# Patient Record
Sex: Male | Born: 1935 | Race: White | Hispanic: No | Smoking: Never smoker
Health system: Southern US, Community
[De-identification: ages and names within clinical notes are randomized; demographics above are authoritative.]

## PROBLEM LIST (undated history)

## (undated) DIAGNOSIS — N400 Enlarged prostate without lower urinary tract symptoms: Secondary | ICD-10-CM

## (undated) DIAGNOSIS — E785 Hyperlipidemia, unspecified: Secondary | ICD-10-CM

## (undated) DIAGNOSIS — M6281 Muscle weakness (generalized): Secondary | ICD-10-CM

## (undated) DIAGNOSIS — Z96659 Presence of unspecified artificial knee joint: Secondary | ICD-10-CM

## (undated) DIAGNOSIS — M810 Age-related osteoporosis without current pathological fracture: Secondary | ICD-10-CM

## (undated) HISTORY — PX: REPLACEMENT TOTAL KNEE BILATERAL: SUR1225

---

## 2007-04-08 DIAGNOSIS — E785 Hyperlipidemia, unspecified: Secondary | ICD-10-CM | POA: Insufficient documentation

## 2008-11-26 DIAGNOSIS — N400 Enlarged prostate without lower urinary tract symptoms: Secondary | ICD-10-CM | POA: Insufficient documentation

## 2009-01-13 DIAGNOSIS — M810 Age-related osteoporosis without current pathological fracture: Secondary | ICD-10-CM | POA: Diagnosis present

## 2012-07-10 DIAGNOSIS — Z96653 Presence of artificial knee joint, bilateral: Secondary | ICD-10-CM | POA: Insufficient documentation

## 2016-04-18 DIAGNOSIS — R2681 Unsteadiness on feet: Secondary | ICD-10-CM | POA: Diagnosis not present

## 2016-04-18 DIAGNOSIS — R42 Dizziness and giddiness: Secondary | ICD-10-CM | POA: Diagnosis not present

## 2016-04-18 DIAGNOSIS — Z96653 Presence of artificial knee joint, bilateral: Secondary | ICD-10-CM | POA: Diagnosis not present

## 2016-04-18 DIAGNOSIS — R29898 Other symptoms and signs involving the musculoskeletal system: Secondary | ICD-10-CM | POA: Diagnosis not present

## 2016-04-18 DIAGNOSIS — R269 Unspecified abnormalities of gait and mobility: Secondary | ICD-10-CM | POA: Diagnosis not present

## 2016-04-21 DIAGNOSIS — R29898 Other symptoms and signs involving the musculoskeletal system: Secondary | ICD-10-CM | POA: Diagnosis not present

## 2016-04-21 DIAGNOSIS — R2681 Unsteadiness on feet: Secondary | ICD-10-CM | POA: Diagnosis not present

## 2016-04-21 DIAGNOSIS — R42 Dizziness and giddiness: Secondary | ICD-10-CM | POA: Diagnosis not present

## 2016-04-21 DIAGNOSIS — R269 Unspecified abnormalities of gait and mobility: Secondary | ICD-10-CM | POA: Diagnosis not present

## 2016-04-21 DIAGNOSIS — Z96653 Presence of artificial knee joint, bilateral: Secondary | ICD-10-CM | POA: Diagnosis not present

## 2016-05-03 DIAGNOSIS — R42 Dizziness and giddiness: Secondary | ICD-10-CM | POA: Diagnosis not present

## 2016-05-03 DIAGNOSIS — Z96653 Presence of artificial knee joint, bilateral: Secondary | ICD-10-CM | POA: Diagnosis not present

## 2016-05-03 DIAGNOSIS — R29898 Other symptoms and signs involving the musculoskeletal system: Secondary | ICD-10-CM | POA: Diagnosis not present

## 2016-05-03 DIAGNOSIS — R2681 Unsteadiness on feet: Secondary | ICD-10-CM | POA: Diagnosis not present

## 2016-05-03 DIAGNOSIS — R269 Unspecified abnormalities of gait and mobility: Secondary | ICD-10-CM | POA: Diagnosis not present

## 2016-05-09 DIAGNOSIS — Z96653 Presence of artificial knee joint, bilateral: Secondary | ICD-10-CM | POA: Diagnosis not present

## 2016-05-09 DIAGNOSIS — R2681 Unsteadiness on feet: Secondary | ICD-10-CM | POA: Diagnosis not present

## 2016-05-09 DIAGNOSIS — R29898 Other symptoms and signs involving the musculoskeletal system: Secondary | ICD-10-CM | POA: Diagnosis not present

## 2016-05-09 DIAGNOSIS — R269 Unspecified abnormalities of gait and mobility: Secondary | ICD-10-CM | POA: Diagnosis not present

## 2016-05-09 DIAGNOSIS — R42 Dizziness and giddiness: Secondary | ICD-10-CM | POA: Diagnosis not present

## 2016-05-15 DIAGNOSIS — R269 Unspecified abnormalities of gait and mobility: Secondary | ICD-10-CM | POA: Diagnosis not present

## 2016-05-15 DIAGNOSIS — Z96653 Presence of artificial knee joint, bilateral: Secondary | ICD-10-CM | POA: Diagnosis not present

## 2016-05-15 DIAGNOSIS — R2681 Unsteadiness on feet: Secondary | ICD-10-CM | POA: Diagnosis not present

## 2016-05-15 DIAGNOSIS — R29898 Other symptoms and signs involving the musculoskeletal system: Secondary | ICD-10-CM | POA: Diagnosis not present

## 2016-05-15 DIAGNOSIS — R42 Dizziness and giddiness: Secondary | ICD-10-CM | POA: Diagnosis not present

## 2016-05-26 DIAGNOSIS — R269 Unspecified abnormalities of gait and mobility: Secondary | ICD-10-CM | POA: Diagnosis not present

## 2016-05-26 DIAGNOSIS — Z96653 Presence of artificial knee joint, bilateral: Secondary | ICD-10-CM | POA: Diagnosis not present

## 2016-05-26 DIAGNOSIS — R29898 Other symptoms and signs involving the musculoskeletal system: Secondary | ICD-10-CM | POA: Diagnosis not present

## 2016-05-26 DIAGNOSIS — R42 Dizziness and giddiness: Secondary | ICD-10-CM | POA: Diagnosis not present

## 2016-05-26 DIAGNOSIS — R2681 Unsteadiness on feet: Secondary | ICD-10-CM | POA: Diagnosis not present

## 2016-06-07 DIAGNOSIS — R269 Unspecified abnormalities of gait and mobility: Secondary | ICD-10-CM | POA: Diagnosis not present

## 2016-06-07 DIAGNOSIS — R29898 Other symptoms and signs involving the musculoskeletal system: Secondary | ICD-10-CM | POA: Diagnosis not present

## 2016-06-07 DIAGNOSIS — R42 Dizziness and giddiness: Secondary | ICD-10-CM | POA: Diagnosis not present

## 2016-06-07 DIAGNOSIS — Z96653 Presence of artificial knee joint, bilateral: Secondary | ICD-10-CM | POA: Diagnosis not present

## 2016-06-07 DIAGNOSIS — R2681 Unsteadiness on feet: Secondary | ICD-10-CM | POA: Diagnosis not present

## 2016-06-09 DIAGNOSIS — R29898 Other symptoms and signs involving the musculoskeletal system: Secondary | ICD-10-CM | POA: Diagnosis not present

## 2016-06-09 DIAGNOSIS — Z96653 Presence of artificial knee joint, bilateral: Secondary | ICD-10-CM | POA: Diagnosis not present

## 2016-06-09 DIAGNOSIS — R269 Unspecified abnormalities of gait and mobility: Secondary | ICD-10-CM | POA: Diagnosis not present

## 2016-06-09 DIAGNOSIS — R42 Dizziness and giddiness: Secondary | ICD-10-CM | POA: Diagnosis not present

## 2016-06-09 DIAGNOSIS — R2681 Unsteadiness on feet: Secondary | ICD-10-CM | POA: Diagnosis not present

## 2016-06-14 DIAGNOSIS — Z96653 Presence of artificial knee joint, bilateral: Secondary | ICD-10-CM | POA: Diagnosis not present

## 2016-06-14 DIAGNOSIS — R269 Unspecified abnormalities of gait and mobility: Secondary | ICD-10-CM | POA: Diagnosis not present

## 2016-06-14 DIAGNOSIS — R29898 Other symptoms and signs involving the musculoskeletal system: Secondary | ICD-10-CM | POA: Diagnosis not present

## 2016-06-14 DIAGNOSIS — R2681 Unsteadiness on feet: Secondary | ICD-10-CM | POA: Diagnosis not present

## 2016-06-14 DIAGNOSIS — R42 Dizziness and giddiness: Secondary | ICD-10-CM | POA: Diagnosis not present

## 2016-06-16 DIAGNOSIS — R269 Unspecified abnormalities of gait and mobility: Secondary | ICD-10-CM | POA: Diagnosis not present

## 2016-06-16 DIAGNOSIS — R42 Dizziness and giddiness: Secondary | ICD-10-CM | POA: Diagnosis not present

## 2016-06-16 DIAGNOSIS — Z96653 Presence of artificial knee joint, bilateral: Secondary | ICD-10-CM | POA: Diagnosis not present

## 2016-06-16 DIAGNOSIS — R2681 Unsteadiness on feet: Secondary | ICD-10-CM | POA: Diagnosis not present

## 2016-06-16 DIAGNOSIS — R29898 Other symptoms and signs involving the musculoskeletal system: Secondary | ICD-10-CM | POA: Diagnosis not present

## 2016-06-20 DIAGNOSIS — L84 Corns and callosities: Secondary | ICD-10-CM | POA: Diagnosis not present

## 2016-06-26 DIAGNOSIS — R3914 Feeling of incomplete bladder emptying: Secondary | ICD-10-CM | POA: Diagnosis not present

## 2016-06-26 DIAGNOSIS — N210 Calculus in bladder: Secondary | ICD-10-CM | POA: Diagnosis not present

## 2016-06-26 DIAGNOSIS — N401 Enlarged prostate with lower urinary tract symptoms: Secondary | ICD-10-CM | POA: Diagnosis not present

## 2016-06-27 DIAGNOSIS — R269 Unspecified abnormalities of gait and mobility: Secondary | ICD-10-CM | POA: Diagnosis not present

## 2016-06-27 DIAGNOSIS — Z96653 Presence of artificial knee joint, bilateral: Secondary | ICD-10-CM | POA: Diagnosis not present

## 2016-06-27 DIAGNOSIS — R2681 Unsteadiness on feet: Secondary | ICD-10-CM | POA: Diagnosis not present

## 2016-06-27 DIAGNOSIS — R29898 Other symptoms and signs involving the musculoskeletal system: Secondary | ICD-10-CM | POA: Diagnosis not present

## 2016-06-27 DIAGNOSIS — R42 Dizziness and giddiness: Secondary | ICD-10-CM | POA: Diagnosis not present

## 2016-06-29 DIAGNOSIS — B355 Tinea imbricata: Secondary | ICD-10-CM | POA: Diagnosis not present

## 2016-06-29 DIAGNOSIS — L570 Actinic keratosis: Secondary | ICD-10-CM | POA: Diagnosis not present

## 2016-07-05 DIAGNOSIS — R29898 Other symptoms and signs involving the musculoskeletal system: Secondary | ICD-10-CM | POA: Diagnosis not present

## 2016-07-05 DIAGNOSIS — Z96653 Presence of artificial knee joint, bilateral: Secondary | ICD-10-CM | POA: Diagnosis not present

## 2016-07-05 DIAGNOSIS — R2681 Unsteadiness on feet: Secondary | ICD-10-CM | POA: Diagnosis not present

## 2016-07-05 DIAGNOSIS — R269 Unspecified abnormalities of gait and mobility: Secondary | ICD-10-CM | POA: Diagnosis not present

## 2016-07-05 DIAGNOSIS — R42 Dizziness and giddiness: Secondary | ICD-10-CM | POA: Diagnosis not present

## 2016-07-12 DIAGNOSIS — R269 Unspecified abnormalities of gait and mobility: Secondary | ICD-10-CM | POA: Diagnosis not present

## 2016-07-12 DIAGNOSIS — R42 Dizziness and giddiness: Secondary | ICD-10-CM | POA: Diagnosis not present

## 2016-07-12 DIAGNOSIS — R29898 Other symptoms and signs involving the musculoskeletal system: Secondary | ICD-10-CM | POA: Diagnosis not present

## 2016-07-12 DIAGNOSIS — R2681 Unsteadiness on feet: Secondary | ICD-10-CM | POA: Diagnosis not present

## 2016-07-12 DIAGNOSIS — Z96653 Presence of artificial knee joint, bilateral: Secondary | ICD-10-CM | POA: Diagnosis not present

## 2016-07-13 DIAGNOSIS — R635 Abnormal weight gain: Secondary | ICD-10-CM | POA: Diagnosis not present

## 2016-07-13 DIAGNOSIS — R5383 Other fatigue: Secondary | ICD-10-CM | POA: Diagnosis not present

## 2016-07-13 DIAGNOSIS — M8580 Other specified disorders of bone density and structure, unspecified site: Secondary | ICD-10-CM | POA: Diagnosis not present

## 2016-07-13 DIAGNOSIS — R2681 Unsteadiness on feet: Secondary | ICD-10-CM | POA: Diagnosis not present

## 2016-07-13 DIAGNOSIS — E785 Hyperlipidemia, unspecified: Secondary | ICD-10-CM | POA: Diagnosis not present

## 2016-07-19 DIAGNOSIS — R42 Dizziness and giddiness: Secondary | ICD-10-CM | POA: Diagnosis not present

## 2016-07-19 DIAGNOSIS — Z96653 Presence of artificial knee joint, bilateral: Secondary | ICD-10-CM | POA: Diagnosis not present

## 2016-07-19 DIAGNOSIS — R2681 Unsteadiness on feet: Secondary | ICD-10-CM | POA: Diagnosis not present

## 2016-07-19 DIAGNOSIS — R269 Unspecified abnormalities of gait and mobility: Secondary | ICD-10-CM | POA: Diagnosis not present

## 2016-07-19 DIAGNOSIS — R29898 Other symptoms and signs involving the musculoskeletal system: Secondary | ICD-10-CM | POA: Diagnosis not present

## 2016-07-25 DIAGNOSIS — Z96653 Presence of artificial knee joint, bilateral: Secondary | ICD-10-CM | POA: Diagnosis not present

## 2016-07-25 DIAGNOSIS — R2681 Unsteadiness on feet: Secondary | ICD-10-CM | POA: Diagnosis not present

## 2016-07-25 DIAGNOSIS — R269 Unspecified abnormalities of gait and mobility: Secondary | ICD-10-CM | POA: Diagnosis not present

## 2016-07-25 DIAGNOSIS — R42 Dizziness and giddiness: Secondary | ICD-10-CM | POA: Diagnosis not present

## 2016-07-25 DIAGNOSIS — R29898 Other symptoms and signs involving the musculoskeletal system: Secondary | ICD-10-CM | POA: Diagnosis not present

## 2016-07-27 DIAGNOSIS — Z96653 Presence of artificial knee joint, bilateral: Secondary | ICD-10-CM | POA: Diagnosis not present

## 2016-07-27 DIAGNOSIS — R29898 Other symptoms and signs involving the musculoskeletal system: Secondary | ICD-10-CM | POA: Diagnosis not present

## 2016-07-27 DIAGNOSIS — R2681 Unsteadiness on feet: Secondary | ICD-10-CM | POA: Diagnosis not present

## 2016-07-27 DIAGNOSIS — R42 Dizziness and giddiness: Secondary | ICD-10-CM | POA: Diagnosis not present

## 2016-07-27 DIAGNOSIS — R269 Unspecified abnormalities of gait and mobility: Secondary | ICD-10-CM | POA: Diagnosis not present

## 2016-08-08 DIAGNOSIS — R42 Dizziness and giddiness: Secondary | ICD-10-CM | POA: Diagnosis not present

## 2016-08-08 DIAGNOSIS — R2681 Unsteadiness on feet: Secondary | ICD-10-CM | POA: Diagnosis not present

## 2016-08-08 DIAGNOSIS — R269 Unspecified abnormalities of gait and mobility: Secondary | ICD-10-CM | POA: Diagnosis not present

## 2016-08-08 DIAGNOSIS — Z96653 Presence of artificial knee joint, bilateral: Secondary | ICD-10-CM | POA: Diagnosis not present

## 2016-08-08 DIAGNOSIS — R29898 Other symptoms and signs involving the musculoskeletal system: Secondary | ICD-10-CM | POA: Diagnosis not present

## 2016-08-11 DIAGNOSIS — Z96653 Presence of artificial knee joint, bilateral: Secondary | ICD-10-CM | POA: Diagnosis not present

## 2016-08-11 DIAGNOSIS — R29898 Other symptoms and signs involving the musculoskeletal system: Secondary | ICD-10-CM | POA: Diagnosis not present

## 2016-08-11 DIAGNOSIS — R2681 Unsteadiness on feet: Secondary | ICD-10-CM | POA: Diagnosis not present

## 2016-08-11 DIAGNOSIS — R42 Dizziness and giddiness: Secondary | ICD-10-CM | POA: Diagnosis not present

## 2016-08-11 DIAGNOSIS — R269 Unspecified abnormalities of gait and mobility: Secondary | ICD-10-CM | POA: Diagnosis not present

## 2016-08-18 DIAGNOSIS — R42 Dizziness and giddiness: Secondary | ICD-10-CM | POA: Diagnosis not present

## 2016-08-18 DIAGNOSIS — R29898 Other symptoms and signs involving the musculoskeletal system: Secondary | ICD-10-CM | POA: Diagnosis not present

## 2016-08-18 DIAGNOSIS — R269 Unspecified abnormalities of gait and mobility: Secondary | ICD-10-CM | POA: Diagnosis not present

## 2016-08-18 DIAGNOSIS — Z96653 Presence of artificial knee joint, bilateral: Secondary | ICD-10-CM | POA: Diagnosis not present

## 2016-08-18 DIAGNOSIS — R2681 Unsteadiness on feet: Secondary | ICD-10-CM | POA: Diagnosis not present

## 2016-08-22 DIAGNOSIS — R2681 Unsteadiness on feet: Secondary | ICD-10-CM | POA: Diagnosis not present

## 2016-08-22 DIAGNOSIS — R269 Unspecified abnormalities of gait and mobility: Secondary | ICD-10-CM | POA: Diagnosis not present

## 2016-08-22 DIAGNOSIS — R29898 Other symptoms and signs involving the musculoskeletal system: Secondary | ICD-10-CM | POA: Diagnosis not present

## 2016-08-22 DIAGNOSIS — R42 Dizziness and giddiness: Secondary | ICD-10-CM | POA: Diagnosis not present

## 2016-08-22 DIAGNOSIS — Z96653 Presence of artificial knee joint, bilateral: Secondary | ICD-10-CM | POA: Diagnosis not present

## 2016-08-25 DIAGNOSIS — R2681 Unsteadiness on feet: Secondary | ICD-10-CM | POA: Diagnosis not present

## 2016-08-25 DIAGNOSIS — R42 Dizziness and giddiness: Secondary | ICD-10-CM | POA: Diagnosis not present

## 2016-08-25 DIAGNOSIS — R269 Unspecified abnormalities of gait and mobility: Secondary | ICD-10-CM | POA: Diagnosis not present

## 2016-08-25 DIAGNOSIS — R29898 Other symptoms and signs involving the musculoskeletal system: Secondary | ICD-10-CM | POA: Diagnosis not present

## 2016-08-25 DIAGNOSIS — Z96653 Presence of artificial knee joint, bilateral: Secondary | ICD-10-CM | POA: Diagnosis not present

## 2016-09-01 DIAGNOSIS — R269 Unspecified abnormalities of gait and mobility: Secondary | ICD-10-CM | POA: Diagnosis not present

## 2016-09-01 DIAGNOSIS — Z96653 Presence of artificial knee joint, bilateral: Secondary | ICD-10-CM | POA: Diagnosis not present

## 2016-09-01 DIAGNOSIS — R29898 Other symptoms and signs involving the musculoskeletal system: Secondary | ICD-10-CM | POA: Diagnosis not present

## 2016-09-01 DIAGNOSIS — R42 Dizziness and giddiness: Secondary | ICD-10-CM | POA: Diagnosis not present

## 2016-09-01 DIAGNOSIS — R2681 Unsteadiness on feet: Secondary | ICD-10-CM | POA: Diagnosis not present

## 2016-09-08 DIAGNOSIS — R269 Unspecified abnormalities of gait and mobility: Secondary | ICD-10-CM | POA: Diagnosis not present

## 2016-09-08 DIAGNOSIS — R29898 Other symptoms and signs involving the musculoskeletal system: Secondary | ICD-10-CM | POA: Diagnosis not present

## 2016-09-08 DIAGNOSIS — R2681 Unsteadiness on feet: Secondary | ICD-10-CM | POA: Diagnosis not present

## 2016-09-08 DIAGNOSIS — R42 Dizziness and giddiness: Secondary | ICD-10-CM | POA: Diagnosis not present

## 2016-09-08 DIAGNOSIS — Z96653 Presence of artificial knee joint, bilateral: Secondary | ICD-10-CM | POA: Diagnosis not present

## 2016-09-11 DIAGNOSIS — R29898 Other symptoms and signs involving the musculoskeletal system: Secondary | ICD-10-CM | POA: Diagnosis not present

## 2016-09-11 DIAGNOSIS — R269 Unspecified abnormalities of gait and mobility: Secondary | ICD-10-CM | POA: Diagnosis not present

## 2016-09-11 DIAGNOSIS — Z96653 Presence of artificial knee joint, bilateral: Secondary | ICD-10-CM | POA: Diagnosis not present

## 2016-09-11 DIAGNOSIS — R42 Dizziness and giddiness: Secondary | ICD-10-CM | POA: Diagnosis not present

## 2016-09-11 DIAGNOSIS — R2681 Unsteadiness on feet: Secondary | ICD-10-CM | POA: Diagnosis not present

## 2016-09-15 DIAGNOSIS — R269 Unspecified abnormalities of gait and mobility: Secondary | ICD-10-CM | POA: Diagnosis not present

## 2016-09-15 DIAGNOSIS — Z96653 Presence of artificial knee joint, bilateral: Secondary | ICD-10-CM | POA: Diagnosis not present

## 2016-09-15 DIAGNOSIS — R2681 Unsteadiness on feet: Secondary | ICD-10-CM | POA: Diagnosis not present

## 2016-09-15 DIAGNOSIS — R42 Dizziness and giddiness: Secondary | ICD-10-CM | POA: Diagnosis not present

## 2016-09-15 DIAGNOSIS — R29898 Other symptoms and signs involving the musculoskeletal system: Secondary | ICD-10-CM | POA: Diagnosis not present

## 2016-09-19 DIAGNOSIS — L84 Corns and callosities: Secondary | ICD-10-CM | POA: Diagnosis not present

## 2016-09-27 DIAGNOSIS — R2681 Unsteadiness on feet: Secondary | ICD-10-CM | POA: Diagnosis not present

## 2016-09-27 DIAGNOSIS — R42 Dizziness and giddiness: Secondary | ICD-10-CM | POA: Diagnosis not present

## 2016-09-27 DIAGNOSIS — R29898 Other symptoms and signs involving the musculoskeletal system: Secondary | ICD-10-CM | POA: Diagnosis not present

## 2016-09-27 DIAGNOSIS — Z96653 Presence of artificial knee joint, bilateral: Secondary | ICD-10-CM | POA: Diagnosis not present

## 2016-09-27 DIAGNOSIS — R269 Unspecified abnormalities of gait and mobility: Secondary | ICD-10-CM | POA: Diagnosis not present

## 2016-10-04 DIAGNOSIS — R2681 Unsteadiness on feet: Secondary | ICD-10-CM | POA: Diagnosis not present

## 2016-10-04 DIAGNOSIS — R269 Unspecified abnormalities of gait and mobility: Secondary | ICD-10-CM | POA: Diagnosis not present

## 2016-10-04 DIAGNOSIS — R29898 Other symptoms and signs involving the musculoskeletal system: Secondary | ICD-10-CM | POA: Diagnosis not present

## 2016-10-04 DIAGNOSIS — R42 Dizziness and giddiness: Secondary | ICD-10-CM | POA: Diagnosis not present

## 2016-10-04 DIAGNOSIS — Z96653 Presence of artificial knee joint, bilateral: Secondary | ICD-10-CM | POA: Diagnosis not present

## 2016-10-06 DIAGNOSIS — Z96653 Presence of artificial knee joint, bilateral: Secondary | ICD-10-CM | POA: Diagnosis not present

## 2016-10-06 DIAGNOSIS — R42 Dizziness and giddiness: Secondary | ICD-10-CM | POA: Diagnosis not present

## 2016-10-06 DIAGNOSIS — R269 Unspecified abnormalities of gait and mobility: Secondary | ICD-10-CM | POA: Diagnosis not present

## 2016-10-06 DIAGNOSIS — R29898 Other symptoms and signs involving the musculoskeletal system: Secondary | ICD-10-CM | POA: Diagnosis not present

## 2016-10-06 DIAGNOSIS — R2681 Unsteadiness on feet: Secondary | ICD-10-CM | POA: Diagnosis not present

## 2016-10-18 DIAGNOSIS — R269 Unspecified abnormalities of gait and mobility: Secondary | ICD-10-CM | POA: Diagnosis not present

## 2016-10-18 DIAGNOSIS — R29898 Other symptoms and signs involving the musculoskeletal system: Secondary | ICD-10-CM | POA: Diagnosis not present

## 2016-10-18 DIAGNOSIS — R42 Dizziness and giddiness: Secondary | ICD-10-CM | POA: Diagnosis not present

## 2016-10-18 DIAGNOSIS — Z96653 Presence of artificial knee joint, bilateral: Secondary | ICD-10-CM | POA: Diagnosis not present

## 2016-10-18 DIAGNOSIS — R2681 Unsteadiness on feet: Secondary | ICD-10-CM | POA: Diagnosis not present

## 2016-10-20 DIAGNOSIS — R269 Unspecified abnormalities of gait and mobility: Secondary | ICD-10-CM | POA: Diagnosis not present

## 2016-10-20 DIAGNOSIS — R2681 Unsteadiness on feet: Secondary | ICD-10-CM | POA: Diagnosis not present

## 2016-10-20 DIAGNOSIS — Z96653 Presence of artificial knee joint, bilateral: Secondary | ICD-10-CM | POA: Diagnosis not present

## 2016-10-20 DIAGNOSIS — R29898 Other symptoms and signs involving the musculoskeletal system: Secondary | ICD-10-CM | POA: Diagnosis not present

## 2016-10-20 DIAGNOSIS — R42 Dizziness and giddiness: Secondary | ICD-10-CM | POA: Diagnosis not present

## 2016-10-23 DIAGNOSIS — R269 Unspecified abnormalities of gait and mobility: Secondary | ICD-10-CM | POA: Diagnosis not present

## 2016-10-23 DIAGNOSIS — R2681 Unsteadiness on feet: Secondary | ICD-10-CM | POA: Diagnosis not present

## 2016-10-23 DIAGNOSIS — R42 Dizziness and giddiness: Secondary | ICD-10-CM | POA: Diagnosis not present

## 2016-10-23 DIAGNOSIS — Z96653 Presence of artificial knee joint, bilateral: Secondary | ICD-10-CM | POA: Diagnosis not present

## 2016-10-23 DIAGNOSIS — R29898 Other symptoms and signs involving the musculoskeletal system: Secondary | ICD-10-CM | POA: Diagnosis not present

## 2016-10-26 DIAGNOSIS — R42 Dizziness and giddiness: Secondary | ICD-10-CM | POA: Diagnosis not present

## 2016-10-26 DIAGNOSIS — R269 Unspecified abnormalities of gait and mobility: Secondary | ICD-10-CM | POA: Diagnosis not present

## 2016-10-26 DIAGNOSIS — Z96653 Presence of artificial knee joint, bilateral: Secondary | ICD-10-CM | POA: Diagnosis not present

## 2016-10-26 DIAGNOSIS — R29898 Other symptoms and signs involving the musculoskeletal system: Secondary | ICD-10-CM | POA: Diagnosis not present

## 2016-10-26 DIAGNOSIS — R2681 Unsteadiness on feet: Secondary | ICD-10-CM | POA: Diagnosis not present

## 2016-10-30 DIAGNOSIS — Z96653 Presence of artificial knee joint, bilateral: Secondary | ICD-10-CM | POA: Diagnosis not present

## 2016-10-30 DIAGNOSIS — R29898 Other symptoms and signs involving the musculoskeletal system: Secondary | ICD-10-CM | POA: Diagnosis not present

## 2016-10-30 DIAGNOSIS — R269 Unspecified abnormalities of gait and mobility: Secondary | ICD-10-CM | POA: Diagnosis not present

## 2016-10-30 DIAGNOSIS — R42 Dizziness and giddiness: Secondary | ICD-10-CM | POA: Diagnosis not present

## 2016-10-30 DIAGNOSIS — R2681 Unsteadiness on feet: Secondary | ICD-10-CM | POA: Diagnosis not present

## 2016-11-02 DIAGNOSIS — R42 Dizziness and giddiness: Secondary | ICD-10-CM | POA: Diagnosis not present

## 2016-11-02 DIAGNOSIS — R2681 Unsteadiness on feet: Secondary | ICD-10-CM | POA: Diagnosis not present

## 2016-11-02 DIAGNOSIS — R269 Unspecified abnormalities of gait and mobility: Secondary | ICD-10-CM | POA: Diagnosis not present

## 2016-11-02 DIAGNOSIS — Z96653 Presence of artificial knee joint, bilateral: Secondary | ICD-10-CM | POA: Diagnosis not present

## 2016-11-02 DIAGNOSIS — R29898 Other symptoms and signs involving the musculoskeletal system: Secondary | ICD-10-CM | POA: Diagnosis not present

## 2016-11-09 DIAGNOSIS — R269 Unspecified abnormalities of gait and mobility: Secondary | ICD-10-CM | POA: Diagnosis not present

## 2016-11-09 DIAGNOSIS — R42 Dizziness and giddiness: Secondary | ICD-10-CM | POA: Diagnosis not present

## 2016-11-09 DIAGNOSIS — R2681 Unsteadiness on feet: Secondary | ICD-10-CM | POA: Diagnosis not present

## 2016-11-09 DIAGNOSIS — Z96653 Presence of artificial knee joint, bilateral: Secondary | ICD-10-CM | POA: Diagnosis not present

## 2016-11-09 DIAGNOSIS — R29898 Other symptoms and signs involving the musculoskeletal system: Secondary | ICD-10-CM | POA: Diagnosis not present

## 2016-11-13 DIAGNOSIS — R269 Unspecified abnormalities of gait and mobility: Secondary | ICD-10-CM | POA: Diagnosis not present

## 2016-11-13 DIAGNOSIS — R29898 Other symptoms and signs involving the musculoskeletal system: Secondary | ICD-10-CM | POA: Diagnosis not present

## 2016-11-13 DIAGNOSIS — R2681 Unsteadiness on feet: Secondary | ICD-10-CM | POA: Diagnosis not present

## 2016-11-13 DIAGNOSIS — R42 Dizziness and giddiness: Secondary | ICD-10-CM | POA: Diagnosis not present

## 2016-11-13 DIAGNOSIS — Z96653 Presence of artificial knee joint, bilateral: Secondary | ICD-10-CM | POA: Diagnosis not present

## 2016-11-17 DIAGNOSIS — Z96653 Presence of artificial knee joint, bilateral: Secondary | ICD-10-CM | POA: Diagnosis not present

## 2016-11-17 DIAGNOSIS — R269 Unspecified abnormalities of gait and mobility: Secondary | ICD-10-CM | POA: Diagnosis not present

## 2016-11-17 DIAGNOSIS — R29898 Other symptoms and signs involving the musculoskeletal system: Secondary | ICD-10-CM | POA: Diagnosis not present

## 2016-11-17 DIAGNOSIS — R2681 Unsteadiness on feet: Secondary | ICD-10-CM | POA: Diagnosis not present

## 2016-11-17 DIAGNOSIS — R42 Dizziness and giddiness: Secondary | ICD-10-CM | POA: Diagnosis not present

## 2016-11-24 DIAGNOSIS — R269 Unspecified abnormalities of gait and mobility: Secondary | ICD-10-CM | POA: Diagnosis not present

## 2016-11-24 DIAGNOSIS — R42 Dizziness and giddiness: Secondary | ICD-10-CM | POA: Diagnosis not present

## 2016-11-24 DIAGNOSIS — R2681 Unsteadiness on feet: Secondary | ICD-10-CM | POA: Diagnosis not present

## 2016-11-24 DIAGNOSIS — R29898 Other symptoms and signs involving the musculoskeletal system: Secondary | ICD-10-CM | POA: Diagnosis not present

## 2016-11-24 DIAGNOSIS — Z96653 Presence of artificial knee joint, bilateral: Secondary | ICD-10-CM | POA: Diagnosis not present

## 2016-11-27 DIAGNOSIS — R42 Dizziness and giddiness: Secondary | ICD-10-CM | POA: Diagnosis not present

## 2016-11-27 DIAGNOSIS — R2681 Unsteadiness on feet: Secondary | ICD-10-CM | POA: Diagnosis not present

## 2016-11-27 DIAGNOSIS — Z96653 Presence of artificial knee joint, bilateral: Secondary | ICD-10-CM | POA: Diagnosis not present

## 2016-11-27 DIAGNOSIS — R29898 Other symptoms and signs involving the musculoskeletal system: Secondary | ICD-10-CM | POA: Diagnosis not present

## 2016-11-27 DIAGNOSIS — R269 Unspecified abnormalities of gait and mobility: Secondary | ICD-10-CM | POA: Diagnosis not present

## 2016-11-29 DIAGNOSIS — R2681 Unsteadiness on feet: Secondary | ICD-10-CM | POA: Diagnosis not present

## 2016-11-29 DIAGNOSIS — R42 Dizziness and giddiness: Secondary | ICD-10-CM | POA: Diagnosis not present

## 2016-11-29 DIAGNOSIS — R269 Unspecified abnormalities of gait and mobility: Secondary | ICD-10-CM | POA: Diagnosis not present

## 2016-11-29 DIAGNOSIS — Z96653 Presence of artificial knee joint, bilateral: Secondary | ICD-10-CM | POA: Diagnosis not present

## 2016-11-29 DIAGNOSIS — R29898 Other symptoms and signs involving the musculoskeletal system: Secondary | ICD-10-CM | POA: Diagnosis not present

## 2016-12-08 DIAGNOSIS — R42 Dizziness and giddiness: Secondary | ICD-10-CM | POA: Diagnosis not present

## 2016-12-08 DIAGNOSIS — R269 Unspecified abnormalities of gait and mobility: Secondary | ICD-10-CM | POA: Diagnosis not present

## 2016-12-08 DIAGNOSIS — Z96653 Presence of artificial knee joint, bilateral: Secondary | ICD-10-CM | POA: Diagnosis not present

## 2016-12-08 DIAGNOSIS — R29898 Other symptoms and signs involving the musculoskeletal system: Secondary | ICD-10-CM | POA: Diagnosis not present

## 2016-12-08 DIAGNOSIS — R2681 Unsteadiness on feet: Secondary | ICD-10-CM | POA: Diagnosis not present

## 2016-12-13 DIAGNOSIS — R29898 Other symptoms and signs involving the musculoskeletal system: Secondary | ICD-10-CM | POA: Diagnosis not present

## 2016-12-13 DIAGNOSIS — R42 Dizziness and giddiness: Secondary | ICD-10-CM | POA: Diagnosis not present

## 2016-12-13 DIAGNOSIS — R2681 Unsteadiness on feet: Secondary | ICD-10-CM | POA: Diagnosis not present

## 2016-12-13 DIAGNOSIS — R5383 Other fatigue: Secondary | ICD-10-CM | POA: Diagnosis not present

## 2016-12-13 DIAGNOSIS — R269 Unspecified abnormalities of gait and mobility: Secondary | ICD-10-CM | POA: Diagnosis not present

## 2016-12-13 DIAGNOSIS — Z96653 Presence of artificial knee joint, bilateral: Secondary | ICD-10-CM | POA: Diagnosis not present

## 2016-12-15 DIAGNOSIS — Z96653 Presence of artificial knee joint, bilateral: Secondary | ICD-10-CM | POA: Diagnosis not present

## 2016-12-15 DIAGNOSIS — R269 Unspecified abnormalities of gait and mobility: Secondary | ICD-10-CM | POA: Diagnosis not present

## 2016-12-15 DIAGNOSIS — R42 Dizziness and giddiness: Secondary | ICD-10-CM | POA: Diagnosis not present

## 2016-12-15 DIAGNOSIS — R2681 Unsteadiness on feet: Secondary | ICD-10-CM | POA: Diagnosis not present

## 2016-12-15 DIAGNOSIS — R29898 Other symptoms and signs involving the musculoskeletal system: Secondary | ICD-10-CM | POA: Diagnosis not present

## 2016-12-25 DIAGNOSIS — N401 Enlarged prostate with lower urinary tract symptoms: Secondary | ICD-10-CM | POA: Diagnosis not present

## 2016-12-25 DIAGNOSIS — R3914 Feeling of incomplete bladder emptying: Secondary | ICD-10-CM | POA: Diagnosis not present

## 2016-12-25 DIAGNOSIS — N210 Calculus in bladder: Secondary | ICD-10-CM | POA: Diagnosis not present

## 2016-12-28 DIAGNOSIS — L570 Actinic keratosis: Secondary | ICD-10-CM | POA: Diagnosis not present

## 2016-12-29 DIAGNOSIS — R29898 Other symptoms and signs involving the musculoskeletal system: Secondary | ICD-10-CM | POA: Diagnosis not present

## 2016-12-29 DIAGNOSIS — Z96653 Presence of artificial knee joint, bilateral: Secondary | ICD-10-CM | POA: Diagnosis not present

## 2016-12-29 DIAGNOSIS — R42 Dizziness and giddiness: Secondary | ICD-10-CM | POA: Diagnosis not present

## 2016-12-29 DIAGNOSIS — R269 Unspecified abnormalities of gait and mobility: Secondary | ICD-10-CM | POA: Diagnosis not present

## 2016-12-29 DIAGNOSIS — R2681 Unsteadiness on feet: Secondary | ICD-10-CM | POA: Diagnosis not present

## 2017-01-01 DIAGNOSIS — Z96653 Presence of artificial knee joint, bilateral: Secondary | ICD-10-CM | POA: Diagnosis not present

## 2017-01-01 DIAGNOSIS — R269 Unspecified abnormalities of gait and mobility: Secondary | ICD-10-CM | POA: Diagnosis not present

## 2017-01-01 DIAGNOSIS — R2681 Unsteadiness on feet: Secondary | ICD-10-CM | POA: Diagnosis not present

## 2017-01-01 DIAGNOSIS — R29898 Other symptoms and signs involving the musculoskeletal system: Secondary | ICD-10-CM | POA: Diagnosis not present

## 2017-01-01 DIAGNOSIS — R42 Dizziness and giddiness: Secondary | ICD-10-CM | POA: Diagnosis not present

## 2017-01-05 DIAGNOSIS — Z96653 Presence of artificial knee joint, bilateral: Secondary | ICD-10-CM | POA: Diagnosis not present

## 2017-01-05 DIAGNOSIS — R269 Unspecified abnormalities of gait and mobility: Secondary | ICD-10-CM | POA: Diagnosis not present

## 2017-01-05 DIAGNOSIS — R42 Dizziness and giddiness: Secondary | ICD-10-CM | POA: Diagnosis not present

## 2017-01-05 DIAGNOSIS — R29898 Other symptoms and signs involving the musculoskeletal system: Secondary | ICD-10-CM | POA: Diagnosis not present

## 2017-01-05 DIAGNOSIS — R2681 Unsteadiness on feet: Secondary | ICD-10-CM | POA: Diagnosis not present

## 2017-01-09 DIAGNOSIS — R29898 Other symptoms and signs involving the musculoskeletal system: Secondary | ICD-10-CM | POA: Diagnosis not present

## 2017-01-09 DIAGNOSIS — Z9181 History of falling: Secondary | ICD-10-CM | POA: Diagnosis not present

## 2017-01-09 DIAGNOSIS — R269 Unspecified abnormalities of gait and mobility: Secondary | ICD-10-CM | POA: Diagnosis not present

## 2017-01-09 DIAGNOSIS — Z96653 Presence of artificial knee joint, bilateral: Secondary | ICD-10-CM | POA: Diagnosis not present

## 2017-01-09 DIAGNOSIS — R2681 Unsteadiness on feet: Secondary | ICD-10-CM | POA: Diagnosis not present

## 2017-01-09 DIAGNOSIS — R42 Dizziness and giddiness: Secondary | ICD-10-CM | POA: Diagnosis not present

## 2017-01-09 DIAGNOSIS — R531 Weakness: Secondary | ICD-10-CM | POA: Diagnosis not present

## 2017-01-11 DIAGNOSIS — L84 Corns and callosities: Secondary | ICD-10-CM | POA: Diagnosis not present

## 2017-01-11 DIAGNOSIS — M216X1 Other acquired deformities of right foot: Secondary | ICD-10-CM | POA: Diagnosis not present

## 2017-01-12 DIAGNOSIS — R269 Unspecified abnormalities of gait and mobility: Secondary | ICD-10-CM | POA: Diagnosis not present

## 2017-01-12 DIAGNOSIS — R29898 Other symptoms and signs involving the musculoskeletal system: Secondary | ICD-10-CM | POA: Diagnosis not present

## 2017-01-12 DIAGNOSIS — Z96653 Presence of artificial knee joint, bilateral: Secondary | ICD-10-CM | POA: Diagnosis not present

## 2017-01-12 DIAGNOSIS — R531 Weakness: Secondary | ICD-10-CM | POA: Diagnosis not present

## 2017-01-12 DIAGNOSIS — R42 Dizziness and giddiness: Secondary | ICD-10-CM | POA: Diagnosis not present

## 2017-01-12 DIAGNOSIS — R2681 Unsteadiness on feet: Secondary | ICD-10-CM | POA: Diagnosis not present

## 2017-01-15 DIAGNOSIS — R5383 Other fatigue: Secondary | ICD-10-CM | POA: Diagnosis not present

## 2017-01-15 DIAGNOSIS — Z23 Encounter for immunization: Secondary | ICD-10-CM | POA: Diagnosis not present

## 2017-01-15 DIAGNOSIS — Z0000 Encounter for general adult medical examination without abnormal findings: Secondary | ICD-10-CM | POA: Diagnosis not present

## 2017-01-15 DIAGNOSIS — R2681 Unsteadiness on feet: Secondary | ICD-10-CM | POA: Diagnosis not present

## 2017-01-15 DIAGNOSIS — M8580 Other specified disorders of bone density and structure, unspecified site: Secondary | ICD-10-CM | POA: Diagnosis not present

## 2017-01-22 DIAGNOSIS — R42 Dizziness and giddiness: Secondary | ICD-10-CM | POA: Diagnosis not present

## 2017-01-22 DIAGNOSIS — Z96653 Presence of artificial knee joint, bilateral: Secondary | ICD-10-CM | POA: Diagnosis not present

## 2017-01-22 DIAGNOSIS — R531 Weakness: Secondary | ICD-10-CM | POA: Diagnosis not present

## 2017-01-22 DIAGNOSIS — R29898 Other symptoms and signs involving the musculoskeletal system: Secondary | ICD-10-CM | POA: Diagnosis not present

## 2017-01-22 DIAGNOSIS — R269 Unspecified abnormalities of gait and mobility: Secondary | ICD-10-CM | POA: Diagnosis not present

## 2017-01-22 DIAGNOSIS — R2681 Unsteadiness on feet: Secondary | ICD-10-CM | POA: Diagnosis not present

## 2017-01-25 DIAGNOSIS — R269 Unspecified abnormalities of gait and mobility: Secondary | ICD-10-CM | POA: Diagnosis not present

## 2017-01-25 DIAGNOSIS — R531 Weakness: Secondary | ICD-10-CM | POA: Diagnosis not present

## 2017-01-25 DIAGNOSIS — R29898 Other symptoms and signs involving the musculoskeletal system: Secondary | ICD-10-CM | POA: Diagnosis not present

## 2017-01-25 DIAGNOSIS — R2681 Unsteadiness on feet: Secondary | ICD-10-CM | POA: Diagnosis not present

## 2017-01-25 DIAGNOSIS — Z96653 Presence of artificial knee joint, bilateral: Secondary | ICD-10-CM | POA: Diagnosis not present

## 2017-01-25 DIAGNOSIS — R42 Dizziness and giddiness: Secondary | ICD-10-CM | POA: Diagnosis not present

## 2017-01-29 DIAGNOSIS — R29898 Other symptoms and signs involving the musculoskeletal system: Secondary | ICD-10-CM | POA: Diagnosis not present

## 2017-01-29 DIAGNOSIS — Z96653 Presence of artificial knee joint, bilateral: Secondary | ICD-10-CM | POA: Diagnosis not present

## 2017-01-29 DIAGNOSIS — R531 Weakness: Secondary | ICD-10-CM | POA: Diagnosis not present

## 2017-01-29 DIAGNOSIS — R269 Unspecified abnormalities of gait and mobility: Secondary | ICD-10-CM | POA: Diagnosis not present

## 2017-01-29 DIAGNOSIS — R42 Dizziness and giddiness: Secondary | ICD-10-CM | POA: Diagnosis not present

## 2017-01-29 DIAGNOSIS — R2681 Unsteadiness on feet: Secondary | ICD-10-CM | POA: Diagnosis not present

## 2017-02-01 DIAGNOSIS — Z96653 Presence of artificial knee joint, bilateral: Secondary | ICD-10-CM | POA: Diagnosis not present

## 2017-02-01 DIAGNOSIS — R2681 Unsteadiness on feet: Secondary | ICD-10-CM | POA: Diagnosis not present

## 2017-02-01 DIAGNOSIS — R42 Dizziness and giddiness: Secondary | ICD-10-CM | POA: Diagnosis not present

## 2017-02-01 DIAGNOSIS — R29898 Other symptoms and signs involving the musculoskeletal system: Secondary | ICD-10-CM | POA: Diagnosis not present

## 2017-02-01 DIAGNOSIS — R269 Unspecified abnormalities of gait and mobility: Secondary | ICD-10-CM | POA: Diagnosis not present

## 2017-02-01 DIAGNOSIS — R531 Weakness: Secondary | ICD-10-CM | POA: Diagnosis not present

## 2017-02-05 DIAGNOSIS — R531 Weakness: Secondary | ICD-10-CM | POA: Diagnosis not present

## 2017-02-05 DIAGNOSIS — R42 Dizziness and giddiness: Secondary | ICD-10-CM | POA: Diagnosis not present

## 2017-02-05 DIAGNOSIS — R269 Unspecified abnormalities of gait and mobility: Secondary | ICD-10-CM | POA: Diagnosis not present

## 2017-02-05 DIAGNOSIS — Z96653 Presence of artificial knee joint, bilateral: Secondary | ICD-10-CM | POA: Diagnosis not present

## 2017-02-05 DIAGNOSIS — R2681 Unsteadiness on feet: Secondary | ICD-10-CM | POA: Diagnosis not present

## 2017-02-05 DIAGNOSIS — R29898 Other symptoms and signs involving the musculoskeletal system: Secondary | ICD-10-CM | POA: Diagnosis not present

## 2017-02-06 DIAGNOSIS — R2681 Unsteadiness on feet: Secondary | ICD-10-CM | POA: Diagnosis not present

## 2017-02-06 DIAGNOSIS — M8580 Other specified disorders of bone density and structure, unspecified site: Secondary | ICD-10-CM | POA: Diagnosis not present

## 2017-02-12 DIAGNOSIS — R2681 Unsteadiness on feet: Secondary | ICD-10-CM | POA: Diagnosis not present

## 2017-02-12 DIAGNOSIS — Z9181 History of falling: Secondary | ICD-10-CM | POA: Diagnosis not present

## 2017-02-12 DIAGNOSIS — Z96653 Presence of artificial knee joint, bilateral: Secondary | ICD-10-CM | POA: Diagnosis not present

## 2017-02-12 DIAGNOSIS — R269 Unspecified abnormalities of gait and mobility: Secondary | ICD-10-CM | POA: Diagnosis not present

## 2017-02-12 DIAGNOSIS — R531 Weakness: Secondary | ICD-10-CM | POA: Diagnosis not present

## 2017-02-12 DIAGNOSIS — R42 Dizziness and giddiness: Secondary | ICD-10-CM | POA: Diagnosis not present

## 2017-02-14 DIAGNOSIS — R42 Dizziness and giddiness: Secondary | ICD-10-CM | POA: Diagnosis not present

## 2017-02-14 DIAGNOSIS — R531 Weakness: Secondary | ICD-10-CM | POA: Diagnosis not present

## 2017-02-14 DIAGNOSIS — R2681 Unsteadiness on feet: Secondary | ICD-10-CM | POA: Diagnosis not present

## 2017-02-14 DIAGNOSIS — R269 Unspecified abnormalities of gait and mobility: Secondary | ICD-10-CM | POA: Diagnosis not present

## 2017-02-14 DIAGNOSIS — Z96653 Presence of artificial knee joint, bilateral: Secondary | ICD-10-CM | POA: Diagnosis not present

## 2017-02-14 DIAGNOSIS — Z9181 History of falling: Secondary | ICD-10-CM | POA: Diagnosis not present

## 2017-02-21 DIAGNOSIS — R531 Weakness: Secondary | ICD-10-CM | POA: Diagnosis not present

## 2017-02-21 DIAGNOSIS — Z9181 History of falling: Secondary | ICD-10-CM | POA: Diagnosis not present

## 2017-02-21 DIAGNOSIS — R2681 Unsteadiness on feet: Secondary | ICD-10-CM | POA: Diagnosis not present

## 2017-02-21 DIAGNOSIS — R42 Dizziness and giddiness: Secondary | ICD-10-CM | POA: Diagnosis not present

## 2017-02-21 DIAGNOSIS — Z96653 Presence of artificial knee joint, bilateral: Secondary | ICD-10-CM | POA: Diagnosis not present

## 2017-02-21 DIAGNOSIS — R269 Unspecified abnormalities of gait and mobility: Secondary | ICD-10-CM | POA: Diagnosis not present

## 2017-02-26 DIAGNOSIS — Z96653 Presence of artificial knee joint, bilateral: Secondary | ICD-10-CM | POA: Diagnosis not present

## 2017-02-26 DIAGNOSIS — R42 Dizziness and giddiness: Secondary | ICD-10-CM | POA: Diagnosis not present

## 2017-02-26 DIAGNOSIS — R269 Unspecified abnormalities of gait and mobility: Secondary | ICD-10-CM | POA: Diagnosis not present

## 2017-02-26 DIAGNOSIS — R2681 Unsteadiness on feet: Secondary | ICD-10-CM | POA: Diagnosis not present

## 2017-02-26 DIAGNOSIS — Z9181 History of falling: Secondary | ICD-10-CM | POA: Diagnosis not present

## 2017-02-26 DIAGNOSIS — R531 Weakness: Secondary | ICD-10-CM | POA: Diagnosis not present

## 2017-03-12 DIAGNOSIS — R269 Unspecified abnormalities of gait and mobility: Secondary | ICD-10-CM | POA: Diagnosis not present

## 2017-03-12 DIAGNOSIS — Z96653 Presence of artificial knee joint, bilateral: Secondary | ICD-10-CM | POA: Diagnosis not present

## 2017-03-12 DIAGNOSIS — Z9181 History of falling: Secondary | ICD-10-CM | POA: Diagnosis not present

## 2017-03-12 DIAGNOSIS — R42 Dizziness and giddiness: Secondary | ICD-10-CM | POA: Diagnosis not present

## 2017-04-18 DIAGNOSIS — L84 Corns and callosities: Secondary | ICD-10-CM | POA: Diagnosis not present

## 2017-07-16 DIAGNOSIS — R2681 Unsteadiness on feet: Secondary | ICD-10-CM | POA: Diagnosis not present

## 2017-07-16 DIAGNOSIS — L84 Corns and callosities: Secondary | ICD-10-CM | POA: Diagnosis not present

## 2017-07-16 DIAGNOSIS — M8580 Other specified disorders of bone density and structure, unspecified site: Secondary | ICD-10-CM | POA: Diagnosis not present

## 2017-07-18 DIAGNOSIS — L84 Corns and callosities: Secondary | ICD-10-CM | POA: Diagnosis not present

## 2017-07-26 DIAGNOSIS — D2261 Melanocytic nevi of right upper limb, including shoulder: Secondary | ICD-10-CM | POA: Diagnosis not present

## 2017-07-26 DIAGNOSIS — L570 Actinic keratosis: Secondary | ICD-10-CM | POA: Diagnosis not present

## 2017-07-26 DIAGNOSIS — D2271 Melanocytic nevi of right lower limb, including hip: Secondary | ICD-10-CM | POA: Diagnosis not present

## 2017-07-26 DIAGNOSIS — D2272 Melanocytic nevi of left lower limb, including hip: Secondary | ICD-10-CM | POA: Diagnosis not present

## 2017-07-26 DIAGNOSIS — D1800 Hemangioma unspecified site: Secondary | ICD-10-CM | POA: Diagnosis not present

## 2017-07-26 DIAGNOSIS — Z872 Personal history of diseases of the skin and subcutaneous tissue: Secondary | ICD-10-CM | POA: Diagnosis not present

## 2017-07-26 DIAGNOSIS — D2262 Melanocytic nevi of left upper limb, including shoulder: Secondary | ICD-10-CM | POA: Diagnosis not present

## 2017-07-26 DIAGNOSIS — D485 Neoplasm of uncertain behavior of skin: Secondary | ICD-10-CM | POA: Diagnosis not present

## 2017-07-26 DIAGNOSIS — D225 Melanocytic nevi of trunk: Secondary | ICD-10-CM | POA: Diagnosis not present

## 2017-08-17 DIAGNOSIS — I444 Left anterior fascicular block: Secondary | ICD-10-CM | POA: Diagnosis not present

## 2017-08-17 DIAGNOSIS — R1013 Epigastric pain: Secondary | ICD-10-CM | POA: Diagnosis not present

## 2017-08-17 DIAGNOSIS — R9431 Abnormal electrocardiogram [ECG] [EKG]: Secondary | ICD-10-CM | POA: Diagnosis not present

## 2017-08-17 DIAGNOSIS — I4510 Unspecified right bundle-branch block: Secondary | ICD-10-CM | POA: Diagnosis not present

## 2017-08-17 DIAGNOSIS — R110 Nausea: Secondary | ICD-10-CM | POA: Diagnosis not present

## 2017-08-17 DIAGNOSIS — R82998 Other abnormal findings in urine: Secondary | ICD-10-CM | POA: Diagnosis not present

## 2017-09-13 DIAGNOSIS — F329 Major depressive disorder, single episode, unspecified: Secondary | ICD-10-CM | POA: Diagnosis not present

## 2017-09-13 DIAGNOSIS — R630 Anorexia: Secondary | ICD-10-CM | POA: Diagnosis not present

## 2017-09-13 DIAGNOSIS — G4710 Hypersomnia, unspecified: Secondary | ICD-10-CM | POA: Diagnosis not present

## 2017-09-13 DIAGNOSIS — G3184 Mild cognitive impairment, so stated: Secondary | ICD-10-CM | POA: Diagnosis not present

## 2017-09-17 DIAGNOSIS — L84 Corns and callosities: Secondary | ICD-10-CM | POA: Diagnosis not present

## 2017-10-16 DIAGNOSIS — R5383 Other fatigue: Secondary | ICD-10-CM | POA: Diagnosis not present

## 2017-10-16 DIAGNOSIS — Z8719 Personal history of other diseases of the digestive system: Secondary | ICD-10-CM | POA: Diagnosis not present

## 2017-10-16 DIAGNOSIS — E559 Vitamin D deficiency, unspecified: Secondary | ICD-10-CM | POA: Diagnosis not present

## 2017-10-16 DIAGNOSIS — M545 Low back pain: Secondary | ICD-10-CM | POA: Diagnosis not present

## 2017-10-16 DIAGNOSIS — M25661 Stiffness of right knee, not elsewhere classified: Secondary | ICD-10-CM | POA: Diagnosis not present

## 2017-10-16 DIAGNOSIS — Z87440 Personal history of urinary (tract) infections: Secondary | ICD-10-CM | POA: Diagnosis not present

## 2017-10-16 DIAGNOSIS — M25662 Stiffness of left knee, not elsewhere classified: Secondary | ICD-10-CM | POA: Diagnosis not present

## 2017-10-16 DIAGNOSIS — R110 Nausea: Secondary | ICD-10-CM | POA: Diagnosis not present

## 2017-10-16 DIAGNOSIS — R413 Other amnesia: Secondary | ICD-10-CM | POA: Diagnosis not present

## 2017-10-24 DIAGNOSIS — F331 Major depressive disorder, recurrent, moderate: Secondary | ICD-10-CM | POA: Diagnosis not present

## 2017-11-20 DIAGNOSIS — L84 Corns and callosities: Secondary | ICD-10-CM | POA: Diagnosis not present

## 2017-11-22 DIAGNOSIS — F331 Major depressive disorder, recurrent, moderate: Secondary | ICD-10-CM | POA: Diagnosis not present

## 2017-12-20 DIAGNOSIS — F331 Major depressive disorder, recurrent, moderate: Secondary | ICD-10-CM | POA: Diagnosis not present

## 2018-01-01 DIAGNOSIS — L84 Corns and callosities: Secondary | ICD-10-CM | POA: Diagnosis not present

## 2018-01-07 DIAGNOSIS — Z23 Encounter for immunization: Secondary | ICD-10-CM | POA: Diagnosis not present

## 2018-03-04 DIAGNOSIS — F331 Major depressive disorder, recurrent, moderate: Secondary | ICD-10-CM | POA: Diagnosis not present

## 2018-03-22 ENCOUNTER — Other Ambulatory Visit: Payer: Self-pay

## 2018-03-22 ENCOUNTER — Inpatient Hospital Stay (HOSPITAL_COMMUNITY)
Admission: EM | Admit: 2018-03-22 | Discharge: 2018-03-24 | DRG: 641 | Disposition: A | Discharge status: Home Health | Payer: Medicare Other | Attending: Internal Medicine | Admitting: Internal Medicine

## 2018-03-22 ENCOUNTER — Encounter (HOSPITAL_COMMUNITY): Payer: Self-pay | Admitting: Emergency Medicine

## 2018-03-22 ENCOUNTER — Emergency Department (HOSPITAL_COMMUNITY): Payer: Medicare Other

## 2018-03-22 DIAGNOSIS — Z79899 Other long term (current) drug therapy: Secondary | ICD-10-CM

## 2018-03-22 DIAGNOSIS — R55 Syncope and collapse: Secondary | ICD-10-CM | POA: Diagnosis present

## 2018-03-22 DIAGNOSIS — M810 Age-related osteoporosis without current pathological fracture: Secondary | ICD-10-CM | POA: Diagnosis not present

## 2018-03-22 DIAGNOSIS — I491 Atrial premature depolarization: Secondary | ICD-10-CM | POA: Diagnosis not present

## 2018-03-22 DIAGNOSIS — S069X9A Unspecified intracranial injury with loss of consciousness of unspecified duration, initial encounter: Secondary | ICD-10-CM | POA: Diagnosis not present

## 2018-03-22 DIAGNOSIS — S299XXA Unspecified injury of thorax, initial encounter: Secondary | ICD-10-CM | POA: Diagnosis not present

## 2018-03-22 DIAGNOSIS — E860 Dehydration: Principal | ICD-10-CM | POA: Diagnosis present

## 2018-03-22 DIAGNOSIS — R197 Diarrhea, unspecified: Secondary | ICD-10-CM | POA: Diagnosis not present

## 2018-03-22 DIAGNOSIS — N179 Acute kidney failure, unspecified: Secondary | ICD-10-CM | POA: Diagnosis not present

## 2018-03-22 DIAGNOSIS — I4510 Unspecified right bundle-branch block: Secondary | ICD-10-CM | POA: Diagnosis not present

## 2018-03-22 DIAGNOSIS — I4430 Unspecified atrioventricular block: Secondary | ICD-10-CM | POA: Diagnosis not present

## 2018-03-22 DIAGNOSIS — E785 Hyperlipidemia, unspecified: Secondary | ICD-10-CM | POA: Diagnosis not present

## 2018-03-22 DIAGNOSIS — F0390 Unspecified dementia without behavioral disturbance: Secondary | ICD-10-CM | POA: Diagnosis present

## 2018-03-22 DIAGNOSIS — W19XXXA Unspecified fall, initial encounter: Secondary | ICD-10-CM | POA: Diagnosis present

## 2018-03-22 DIAGNOSIS — N400 Enlarged prostate without lower urinary tract symptoms: Secondary | ICD-10-CM | POA: Diagnosis present

## 2018-03-22 DIAGNOSIS — J069 Acute upper respiratory infection, unspecified: Secondary | ICD-10-CM | POA: Diagnosis present

## 2018-03-22 DIAGNOSIS — M47812 Spondylosis without myelopathy or radiculopathy, cervical region: Secondary | ICD-10-CM | POA: Diagnosis present

## 2018-03-22 DIAGNOSIS — F329 Major depressive disorder, single episode, unspecified: Secondary | ICD-10-CM | POA: Diagnosis present

## 2018-03-22 DIAGNOSIS — I371 Nonrheumatic pulmonary valve insufficiency: Secondary | ICD-10-CM | POA: Diagnosis present

## 2018-03-22 DIAGNOSIS — Z66 Do not resuscitate: Secondary | ICD-10-CM | POA: Diagnosis present

## 2018-03-22 DIAGNOSIS — Z96653 Presence of artificial knee joint, bilateral: Secondary | ICD-10-CM | POA: Diagnosis present

## 2018-03-22 DIAGNOSIS — Z87898 Personal history of other specified conditions: Secondary | ICD-10-CM | POA: Diagnosis present

## 2018-03-22 DIAGNOSIS — I440 Atrioventricular block, first degree: Secondary | ICD-10-CM | POA: Diagnosis not present

## 2018-03-22 DIAGNOSIS — M542 Cervicalgia: Secondary | ICD-10-CM | POA: Diagnosis not present

## 2018-03-22 DIAGNOSIS — Z791 Long term (current) use of non-steroidal anti-inflammatories (NSAID): Secondary | ICD-10-CM

## 2018-03-22 DIAGNOSIS — S199XXA Unspecified injury of neck, initial encounter: Secondary | ICD-10-CM | POA: Diagnosis not present

## 2018-03-22 HISTORY — DX: Presence of unspecified artificial knee joint: Z96.659

## 2018-03-22 HISTORY — DX: Hyperlipidemia, unspecified: E78.5

## 2018-03-22 HISTORY — DX: Age-related osteoporosis without current pathological fracture: M81.0

## 2018-03-22 HISTORY — DX: Benign prostatic hyperplasia without lower urinary tract symptoms: N40.0

## 2018-03-22 HISTORY — DX: Muscle weakness (generalized): M62.81

## 2018-03-22 LAB — CBC WITH DIFFERENTIAL/PLATELET
Abs Immature Granulocytes: 0.03 10*3/uL (ref 0.00–0.07)
Basophils Absolute: 0 10*3/uL (ref 0.0–0.1)
Basophils Relative: 0 %
Eosinophils Absolute: 0.1 10*3/uL (ref 0.0–0.5)
Eosinophils Relative: 1 %
HCT: 40.3 % (ref 39.0–52.0)
Hemoglobin: 12.8 g/dL — ABNORMAL LOW (ref 13.0–17.0)
Immature Granulocytes: 0 %
Lymphocytes Relative: 13 %
Lymphs Abs: 1.1 10*3/uL (ref 0.7–4.0)
MCH: 33.5 pg (ref 26.0–34.0)
MCHC: 31.8 g/dL (ref 30.0–36.0)
MCV: 105.5 fL — ABNORMAL HIGH (ref 80.0–100.0)
Monocytes Absolute: 0.6 10*3/uL (ref 0.1–1.0)
Monocytes Relative: 7 %
Neutro Abs: 6.4 10*3/uL (ref 1.7–7.7)
Neutrophils Relative %: 79 %
Platelets: 144 10*3/uL — ABNORMAL LOW (ref 150–400)
RBC: 3.82 MIL/uL — ABNORMAL LOW (ref 4.22–5.81)
RDW: 12.4 % (ref 11.5–15.5)
WBC: 8.2 10*3/uL (ref 4.0–10.5)
nRBC: 0 % (ref 0.0–0.2)

## 2018-03-22 LAB — COMPREHENSIVE METABOLIC PANEL
ALT: 10 U/L (ref 0–44)
AST: 19 U/L (ref 15–41)
Albumin: 3 g/dL — ABNORMAL LOW (ref 3.5–5.0)
Alkaline Phosphatase: 44 U/L (ref 38–126)
Anion gap: 11 (ref 5–15)
BUN: 15 mg/dL (ref 8–23)
CO2: 21 mmol/L — ABNORMAL LOW (ref 22–32)
Calcium: 8 mg/dL — ABNORMAL LOW (ref 8.9–10.3)
Chloride: 109 mmol/L (ref 98–111)
Creatinine, Ser: 1.29 mg/dL — ABNORMAL HIGH (ref 0.61–1.24)
GFR calc Af Amer: 57 mL/min — ABNORMAL LOW (ref >60)
GFR calc non Af Amer: 50 mL/min — ABNORMAL LOW (ref >60)
Glucose, Bld: 86 mg/dL (ref 70–99)
Potassium: 4.1 mmol/L (ref 3.5–5.1)
Sodium: 141 mmol/L (ref 135–145)
Total Bilirubin: 0.8 mg/dL (ref 0.3–1.2)
Total Protein: 5.4 g/dL — ABNORMAL LOW (ref 6.5–8.1)

## 2018-03-22 LAB — TROPONIN I: Troponin I: <0.03 ng/mL (ref <0.03)

## 2018-03-22 MED ORDER — ONDANSETRON HCL 4 MG PO TABS: 4.0000 mg | ORAL_TABLET | Freq: Four times a day (QID) | ORAL | Status: DC | PRN

## 2018-03-22 MED ORDER — ENOXAPARIN SODIUM 40 MG/0.4ML ~~LOC~~ SOLN
40.0000 mg | SUBCUTANEOUS | Status: DC
  Administered 2018-03-22 – 2018-03-23 (×2): 40 mg via SUBCUTANEOUS
  Filled 2018-03-22 (×2): qty 0.4

## 2018-03-22 MED ORDER — ONDANSETRON HCL 4 MG/2ML IJ SOLN: 4.0000 mg | Freq: Four times a day (QID) | INTRAMUSCULAR | Status: DC | PRN

## 2018-03-22 MED ORDER — FENTANYL CITRATE (PF) 100 MCG/2ML IJ SOLN
25.0000 ug | Freq: Once | INTRAMUSCULAR | Status: DC
  Filled 2018-03-22: qty 2

## 2018-03-22 MED ORDER — SODIUM CHLORIDE 0.9% FLUSH
3.0000 mL | Freq: Two times a day (BID) | INTRAVENOUS | Status: DC
  Administered 2018-03-22 – 2018-03-23 (×3): 3 mL via INTRAVENOUS

## 2018-03-22 MED ORDER — BUPROPION HCL ER (XL) 150 MG PO TB24
150.0000 mg | ORAL_TABLET | Freq: Every day | ORAL | Status: DC
  Administered 2018-03-22 – 2018-03-24 (×3): 150 mg via ORAL
  Filled 2018-03-22 (×3): qty 1

## 2018-03-22 MED ORDER — LOPERAMIDE HCL 2 MG PO CAPS: 2.0000 mg | ORAL_CAPSULE | ORAL | Status: DC | PRN

## 2018-03-22 MED ORDER — ACETAMINOPHEN 500 MG PO TABS
500.0000 mg | ORAL_TABLET | Freq: Once | ORAL | Status: AC
  Administered 2018-03-22: 500 mg via ORAL
  Filled 2018-03-22: qty 1

## 2018-03-22 MED ORDER — IBUPROFEN 400 MG PO TABS: 400.0000 mg | ORAL_TABLET | Freq: Four times a day (QID) | ORAL | Status: DC | PRN

## 2018-03-22 MED ORDER — POLYETHYLENE GLYCOL 3350 17 G PO PACK: 17.0000 g | PACK | Freq: Every day | ORAL | Status: DC | PRN

## 2018-03-22 MED ORDER — SODIUM CHLORIDE 0.9 % IV SOLN
INTRAVENOUS | Status: DC
  Administered 2018-03-22: 125 mL/h via INTRAVENOUS
  Administered 2018-03-22 – 2018-03-24 (×4): via INTRAVENOUS

## 2018-03-22 MED ORDER — HYDROCODONE-ACETAMINOPHEN 5-325 MG PO TABS: 1.0000 | ORAL_TABLET | ORAL | Status: DC | PRN

## 2018-03-22 MED ORDER — DONEPEZIL HCL 5 MG PO TABS
5.0000 mg | ORAL_TABLET | Freq: Every day | ORAL | Status: DC
  Administered 2018-03-22 – 2018-03-23 (×2): 5 mg via ORAL
  Filled 2018-03-22 (×2): qty 1

## 2018-03-22 NOTE — H&P (Signed)
History and Physical  Kirubel Aja UKG:[id number] DOB: 10/12/1928 DOA: 03/22/2018  Referring physician: Dr Vanita Panda, ED physician PCP: Antony Contras, MD  Outpatient Specialists:   Patient Coming From: Home  Chief Complaint: Syncope and collapse  HPI: Steven Mullen is a 84 y.o. male with a history of osteoporosis, mild dementia on Aricept, leg weakness.  Patient seen for syncope and collapse that happened earlier today.  Patient has been on cold medicines for viral upper respiratory infection.  He has been taking Alka-Seltzer.  He had a sudden labs after standing up.  Denies chest pain before or after event.  He did lose consciousness for several moments.  This is never happened to him before.  He still feels fairly weak.  Additionally, the patient did have an episode of diarrhea shortly before EMS arrived.  He did have more loose stool in the ED.  Emergency Department Course: Blood work revealed a creatinine of 1.2.  Baseline between 0.9 and 1.0.  Patient had IV fluids started.  CT was negative for acute head trauma  Review of Systems:   Pt denies any fevers, chills, nausea, vomiting, diarrhea, constipation, abdominal pain, shortness of breath, dyspnea on exertion, orthopnea, cough, wheezing, palpitations, headache, vision changes, lightheadedness, dizziness, melena, rectal bleeding.  Review of systems are otherwise negative  Past Medical History:  Diagnosis Date  . BPH (benign prostatic hyperplasia)   . History of knee replacement   . Hyperlipidemia   . Osteoporosis   . Quadriceps weakness    Past Surgical History:  Procedure Laterality Date  . REPLACEMENT TOTAL KNEE BILATERAL     Social History:  reports that he has never smoked. He does not have any smokeless tobacco history on file. He reports previous alcohol use. No history on file for drug. Patient lives with daughter  No Known Allergies  Family history reviewed. No pertinent family history.  Prior to Admission  medications   Medication Sig Start Date End Date Taking? Authorizing Provider  buPROPion (WELLBUTRIN XL) 150 MG 24 hr tablet Take 150 mg by mouth daily. 11/18/17  Yes [provider]  Calcium Carbonate-Vitamin D (CALCIUM 500 + D) 500-125 MG-UNIT TABS Take 1 tablet by mouth daily.   Yes [provider]  donepezil (ARICEPT) 5 MG tablet Take 5 mg by mouth at bedtime. 11/17/17  Yes [provider]  ibuprofen (ADVIL,MOTRIN) 200 MG tablet Take 400 mg by mouth every 6 (six) hours as needed for moderate pain (for back).   Yes [provider]  Melatonin 1 MG TABS Take 3 mg by mouth at bedtime.   Yes [provider]  ondansetron (ZOFRAN) 4 MG tablet Take 4 mg by mouth every 8 (eight) hours as needed for nausea or vomiting.   Yes [provider]  Vitamin D, Ergocalciferol, (DRISDOL) 1.25 MG (50000 UT) CAPS capsule Take 50,000 capsules by mouth every Friday.   Yes [provider]  OVER THE COUNTER MEDICATION Take 2 tablets by mouth as needed. Alka seltzer cold/night    [provider]    Physical Exam: BP 117/71   Pulse 60   Temp 97.6 F (36.4 C) (Oral)   Resp 15   SpO2 99%   . General: Elderly Caucasian male. Awake and alert and oriented x3. No acute cardiopulmonary distress.  Marland Kitchen HEENT: Normocephalic atraumatic.  Right and left ears normal in appearance.  Pupils equal, round, reactive to light. Extraocular muscles are intact. Sclerae anicteric and noninjected.  Moist mucosal membranes. No mucosal lesions.  Marland Kitchen  Neck: Neck supple without lymphadenopathy. No carotid bruits. No masses palpated.  . Cardiovascular: Regular rate with normal S1-S2 sounds. No murmurs, rubs, gallops auscultated. No JVD.  Marland Kitchen Respiratory: Good respiratory effort with no wheezes, rales, rhonchi. Lungs clear to auscultation bilaterally.  No accessory muscle use. . Abdomen: Soft, nontender, nondistended. Active bowel sounds. No masses or hepatosplenomegaly  . Skin: No  rashes, lesions, or ulcerations.  Dry, warm to touch. 2+ dorsalis pedis and radial pulses. . Musculoskeletal: No calf or leg pain. All major joints not erythematous nontender.  No upper or lower joint deformation.  Good ROM.  No contractures  . Psychiatric: Intact judgment and insight. Pleasant and cooperative. . Neurologic: No focal neurological deficits. Strength is 5/5 and symmetric in upper and lower extremities.  Cranial nerves II through XII are grossly intact.           Labs on Admission: I have personally reviewed following labs and imaging studies  CBC: Recent Labs  Lab 03/22/18 0857  WBC 8.2  NEUTROABS 6.4  HGB 12.8*  HCT 40.3  MCV 105.5*  PLT 081*   Basic Metabolic Panel: Recent Labs  Lab 03/22/18 0857  NA 141  K 4.1  CL 109  CO2 21*  GLUCOSE 86  BUN 15  CREATININE 1.29*  CALCIUM 8.0*   GFR: CrCl cannot be calculated (Unknown ideal weight.). Liver Function Tests: Recent Labs  Lab 03/22/18 0857  AST 19  ALT 10  ALKPHOS 44  BILITOT 0.8  PROT 5.4*  ALBUMIN 3.0*   No results for input(s): LIPASE, AMYLASE in the last 168 hours. No results for input(s): AMMONIA in the last 168 hours. Coagulation Profile: No results for input(s): INR, PROTIME in the last 168 hours. Cardiac Enzymes: Recent Labs  Lab 03/22/18 0857  TROPONINI <0.03   BNP (last 3 results) No results for input(s): PROBNP in the last 8760 hours. HbA1C: No results for input(s): HGBA1C in the last 72 hours. CBG: No results for input(s): GLUCAP in the last 168 hours. Lipid Profile: No results for input(s): CHOL, HDL, LDLCALC, TRIG, CHOLHDL, LDLDIRECT in the last 72 hours. Thyroid Function Tests: No results for input(s): TSH, T4TOTAL, FREET4, T3FREE, THYROIDAB in the last 72 hours. Anemia Panel: No results for input(s): VITAMINB12, FOLATE, FERRITIN, TIBC, IRON, RETICCTPCT in the last 72 hours. Urine analysis: No results found for: COLORURINE, APPEARANCEUR, LABSPEC, PHURINE, GLUCOSEU,  HGBUR, BILIRUBINUR, KETONESUR, PROTEINUR, UROBILINOGEN, NITRITE, LEUKOCYTESUR Sepsis Labs: @LABRCNTIP (procalcitonin:4,lacticidven:4) )No results found for this or any previous visit (from the past 240 hour(s)).   Radiological Exams on Admission: Dg Chest 2 View  Result Date: 03/22/2018 CLINICAL DATA:  Syncopal episode with fall, initial encounter EXAM: CHEST - 2 VIEW COMPARISON:  None. FINDINGS: Cardiac shadows within normal limits. Aortic calcifications are identified. The lungs are well aerated bilaterally without focal infiltrate or sizable effusion. No bony abnormality is noted. Calcified granuloma is noted on the lateral projection anteriorly. IMPRESSION: No acute abnormality noted. Electronically Signed   By: Inez Catalina M.D.   On: 03/22/2018 09:19   Ct Head Wo Contrast  Result Date: 03/22/2018 CLINICAL DATA:  Fall this morning. Posterior neck pain. No headache. Brief loss of conscious. EXAM: CT HEAD WITHOUT CONTRAST CT CERVICAL SPINE WITHOUT CONTRAST TECHNIQUE: Multidetector CT imaging of the head and cervical spine was performed following the standard protocol without intravenous contrast. Multiplanar CT image reconstructions of the cervical spine were also generated. COMPARISON:  None. FINDINGS: CT HEAD FINDINGS Brain: Examination demonstrates mild age related atrophic change and  minimal chronic ischemic microvascular disease. There is no mass, mass effect, shift of midline structures or acute hemorrhage. No evidence of acute infarction. Vascular: No hyperdense vessel or unexpected calcification. Skull: Normal. Negative for fracture or focal lesion. Sinuses/Orbits: Orbits are normal. Paranasal sinuses are well developed and demonstrate mild mucosal membrane thickening over the floor the maxillary sinuses. Mastoid air cells are clear. Other: None. CT CERVICAL SPINE FINDINGS Alignment: Subtle 2 mm anterior subluxation of C7 on T1 likely degenerative in nature due to moderate associated facet  arthropathy. No traumatic subluxation. Skull base and vertebrae: Vertebral body heights are normal. There is moderate spondylosis throughout the cervical spine. Atlantoaxial articulation is unremarkable. There is moderate uncovertebral joint spurring and facet arthropathy. No acute fracture. Mild bilateral neural foraminal narrowing right worse than left over several levels of the mid to lower cervical spine. Soft tissues and spinal canal: No prevertebral fluid or swelling. No visible canal hematoma. Disc levels: Severe disc space narrowing at all levels of the cervical spine most prominent at the C5-6 and C6-7 levels. Upper chest: Calcified plaque over the aortic arch. Other: None. IMPRESSION: No acute brain injury. Mild atrophy and chronic ischemic microvascular disease. No acute cervical spine injury. Moderate spondylosis of the cervical spine with significant multilevel disc disease throughout the lower cervical spine and mild bilateral neural foraminal narrowing right worse than left over several levels of the mid to lower cervical spine. Aortic Atherosclerosis (ICD10-I70.0). Mild chronic sinus inflammatory change. Electronically Signed   By: Marin Olp M.D.   On: 03/22/2018 11:21   Ct Cervical Spine Wo Contrast  Result Date: 03/22/2018 CLINICAL DATA:  Fall this morning. Posterior neck pain. No headache. Brief loss of conscious. EXAM: CT HEAD WITHOUT CONTRAST CT CERVICAL SPINE WITHOUT CONTRAST TECHNIQUE: Multidetector CT imaging of the head and cervical spine was performed following the standard protocol without intravenous contrast. Multiplanar CT image reconstructions of the cervical spine were also generated. COMPARISON:  None. FINDINGS: CT HEAD FINDINGS Brain: Examination demonstrates mild age related atrophic change and minimal chronic ischemic microvascular disease. There is no mass, mass effect, shift of midline structures or acute hemorrhage. No evidence of acute infarction. Vascular: No  hyperdense vessel or unexpected calcification. Skull: Normal. Negative for fracture or focal lesion. Sinuses/Orbits: Orbits are normal. Paranasal sinuses are well developed and demonstrate mild mucosal membrane thickening over the floor the maxillary sinuses. Mastoid air cells are clear. Other: None. CT CERVICAL SPINE FINDINGS Alignment: Subtle 2 mm anterior subluxation of C7 on T1 likely degenerative in nature due to moderate associated facet arthropathy. No traumatic subluxation. Skull base and vertebrae: Vertebral body heights are normal. There is moderate spondylosis throughout the cervical spine. Atlantoaxial articulation is unremarkable. There is moderate uncovertebral joint spurring and facet arthropathy. No acute fracture. Mild bilateral neural foraminal narrowing right worse than left over several levels of the mid to lower cervical spine. Soft tissues and spinal canal: No prevertebral fluid or swelling. No visible canal hematoma. Disc levels: Severe disc space narrowing at all levels of the cervical spine most prominent at the C5-6 and C6-7 levels. Upper chest: Calcified plaque over the aortic arch. Other: None. IMPRESSION: No acute brain injury. Mild atrophy and chronic ischemic microvascular disease. No acute cervical spine injury. Moderate spondylosis of the cervical spine with significant multilevel disc disease throughout the lower cervical spine and mild bilateral neural foraminal narrowing right worse than left over several levels of the mid to lower cervical spine. Aortic Atherosclerosis (ICD10-I70.0). Mild chronic sinus inflammatory change.  Electronically Signed   By: Marin Olp M.D.   On: 03/22/2018 11:21    EKG: Independently reviewed.  Sinus rhythm with slightly prolonged PR interval.  Right bundle branch block.  No acute ST changes.  Assessment/Plan: Principal Problem:   Syncope Active Problems:   Osteoporosis   Acute renal injury ([place])   Dehydration    This patient was  discussed with the ED physician, including pertinent vitals, physical exam findings, labs, and imaging.  We also discussed care given by the ED provider.  1. Syncope a. Observation on telemetry b. IV hydration c. It appears that the patient's EKG is relatively unchanged from the description in May in 2019.  Unfortunately, I am unable to view the actual EKG. d. Repeat BMP in the morning e. Echo tomorrow if telemetry monitoring shows any arrhythmias. f. Orthostatics 2. Dehydration a. Rehydrate 3. Acute renal injury a. Recheck creatinine in the morning after rehydration 4. Osteoporosis  DVT prophylaxis: Lovenox Consultants: None Code Status: DNR Family Communication: Daughter present during interview and exam Disposition Plan: Patient should be able to return home following improvement   Truett Mainland, DO [facility] Pager [phone]  If 7PM-7AM, please contact night-coverage [email or web address] Password [email or web address]

## 2018-03-22 NOTE — ED Triage Notes (Signed)
Pt in from home via [facility], per report pt lives with his daughter, pt found pt in the prone position this morning in the hallway this morning, pt last seen last night before bed, pt reports he recalls events and was only on the hallway for a few seconds, pt in towel roll d/t c/o neck and back pain, pt does not blood thinners, MAE, A&O x 4

## 2018-03-22 NOTE — ED Notes (Signed)
Placed pt on bedpan tolerated well 

## 2018-03-22 NOTE — ED Notes (Signed)
Pt was able to use bedside toilet with one assist, tolerated well. Pt has small BM and small void.

## 2018-03-22 NOTE — ED Provider Notes (Signed)
[facility] EMERGENC[facility] DEPARTMENT Provider Note   CSN: [id number] Arrival date & time: 03/22/18  3810     History   Chief Complaint Chief Complaint  Patient presents with  . Loss of Consciousness    HPI Steven Mullen is a 84 y.o. male.  HPI Patient presents after an episode of syncope. Patient is awake and alert currently, denies any complaints, beyond mild neck pain. He notes that he has had recent mild generalized illness, but has had no recent changes in medication, diet, activity. Today, he had episode of syncope. This occurred soon after the patient stood up. He recalls feeling no chest pain either before or after the event, states that he lost consciousness for several moments, but has had no persistent headache afterwards, vision changes or other focal weakness. Patient arrives via EMS who assists with the HPI.  Family notes the patient fell striking his head. He lives with his daughter, who provides this detail.  They note that the patient was hypotensive on arrival, though he improved with fluids.   Social history: No alcohol No smoking  Past medical history: Cognitive changes Anxiety  Home Medications    Prior to Admission medications   Medication Sig Start Date End Date Taking? Authorizing Provider  buPROPion (WELLBUTRIN XL) 150 MG 24 hr tablet Take 150 mg by mouth daily. 11/18/17  Yes [provider]  Calcium Carbonate-Vitamin D (CALCIUM 500 + D) 500-125 MG-UNIT TABS Take 1 tablet by mouth daily.   Yes [provider]  donepezil (ARICEPT) 5 MG tablet Take 5 mg by mouth at bedtime. 11/17/17  Yes [provider]  ibuprofen (ADVIL,MOTRIN) 200 MG tablet Take 400 mg by mouth every 6 (six) hours as needed for moderate pain (for back).   Yes [provider]  Melatonin 1 MG TABS Take 3 mg by mouth at bedtime.   Yes [provider]  ondansetron (ZOFRAN) 4 MG tablet Take 4 mg by mouth every 8 (eight)  hours as needed for nausea or vomiting.   Yes [provider]  Vitamin D, Ergocalciferol, (DRISDOL) 1.25 MG (50000 UT) CAPS capsule Take 50,000 capsules by mouth every Friday.   Yes [provider]  OVER THE COUNTER MEDICATION Take 2 tablets by mouth as needed. Alka seltzer cold/night    [provider]    Family History No family history on file.  Social History Social History   Tobacco Use  . Smoking status: Never Smoker  Substance Use Topics  . Alcohol use: Not Currently  . Drug use: Not on file     Allergies   Patient has no known allergies.   Review of Systems Review of Systems  Constitutional:       Per HPI, otherwise negative  HENT:       Per HPI, otherwise negative  Respiratory:       Per HPI, otherwise negative  Cardiovascular:       Per HPI, otherwise negative  Gastrointestinal: Positive for diarrhea (One episode of loose stool after EMS arrival). Negative for vomiting.  Endocrine:       Negative aside from HPI  Genitourinary:       Neg aside from HPI   Musculoskeletal:       Per HPI, otherwise negative  Skin: Negative.   Neurological: Positive for syncope.     Physical Exam Updated Vital Signs BP 110/71   Pulse (!) 54   Temp 97.6 F (36.4 C) (Oral)   Resp (!) 25  SpO2 95%   Physical Exam Vitals signs and nursing note reviewed.  Constitutional:      General: He is not in acute distress.    Appearance: He is well-developed.  HENT:     Head: Normocephalic and atraumatic.  Eyes:     Conjunctiva/sclera: Conjunctivae normal.  Neck:     Comments: Towel roll immobilization in place. Cardiovascular:     Rate and Rhythm: Normal rate and regular rhythm.  Pulmonary:     Effort: Pulmonary effort is normal. No respiratory distress.     Breath sounds: No stridor.  Abdominal:     General: There is no distension.  Skin:    General: Skin is warm and dry.  Neurological:     Mental Status: He is alert and oriented to  person, place, and time.     Cranial Nerves: No cranial nerve deficit.     Motor: Atrophy present. No tremor or abnormal muscle tone.     Coordination: Coordination normal.      ED Treatments / Results  Labs (all labs ordered are listed, but only abnormal results are displayed) Labs Reviewed  COMPREHENSIVE METABOLIC PANEL - Abnormal; Notable for the following components:      Result Value   CO2 21 (*)    Creatinine, Ser 1.29 (*)    Calcium 8.0 (*)    Total Protein 5.4 (*)    Albumin 3.0 (*)    GFR calc non Af Amer 50 (*)    GFR calc Af Amer 57 (*)    All other components within normal limits  CBC WITH DIFFERENTIAL/PLATELET - Abnormal; Notable for the following components:   RBC 3.82 (*)    Hemoglobin 12.8 (*)    MCV 105.5 (*)    Platelets 144 (*)    All other components within normal limits  TROPONIN I  CBG MONITORING, ED    EKG EKG Interpretation  Date/Time:  Friday March 22 2018 08:45:42 EST Ventricular Rate:  56 PR Interval:    QRS Duration: 129 QT Interval:  450 QTC Calculation: 435 R Axis:   -36 Text Interpretation:  Sinus rhythm Prolonged PR interval Right bundle branch block T wave abnormality Abnormal ekg Confirmed by Carmin Muskrat [phone]) on 03/22/2018 8:57:38 AM   Radiology Dg Chest 2 View  Result Date: 03/22/2018 CLINICAL DATA:  Syncopal episode with fall, initial encounter EXAM: CHEST - 2 VIEW COMPARISON:  None. FINDINGS: Cardiac shadows within normal limits. Aortic calcifications are identified. The lungs are well aerated bilaterally without focal infiltrate or sizable effusion. No bony abnormality is noted. Calcified granuloma is noted on the lateral projection anteriorly. IMPRESSION: No acute abnormality noted. Electronically Signed   By: Inez Catalina M.D.   On: 03/22/2018 09:19   Ct Head Wo Contrast  Result Date: 03/22/2018 CLINICAL DATA:  Fall this morning. Posterior neck pain. No headache. Brief loss of conscious. EXAM: CT HEAD WITHOUT  CONTRAST CT CERVICAL SPINE WITHOUT CONTRAST TECHNIQUE: Multidetector CT imaging of the head and cervical spine was performed following the standard protocol without intravenous contrast. Multiplanar CT image reconstructions of the cervical spine were also generated. COMPARISON:  None. FINDINGS: CT HEAD FINDINGS Brain: Examination demonstrates mild age related atrophic change and minimal chronic ischemic microvascular disease. There is no mass, mass effect, shift of midline structures or acute hemorrhage. No evidence of acute infarction. Vascular: No hyperdense vessel or unexpected calcification. Skull: Normal. Negative for fracture or focal lesion. Sinuses/Orbits: Orbits are normal. Paranasal sinuses are well developed and demonstrate  mild mucosal membrane thickening over the floor the maxillary sinuses. Mastoid air cells are clear. Other: None. CT CERVICAL SPINE FINDINGS Alignment: Subtle 2 mm anterior subluxation of C7 on T1 likely degenerative in nature due to moderate associated facet arthropathy. No traumatic subluxation. Skull base and vertebrae: Vertebral body heights are normal. There is moderate spondylosis throughout the cervical spine. Atlantoaxial articulation is unremarkable. There is moderate uncovertebral joint spurring and facet arthropathy. No acute fracture. Mild bilateral neural foraminal narrowing right worse than left over several levels of the mid to lower cervical spine. Soft tissues and spinal canal: No prevertebral fluid or swelling. No visible canal hematoma. Disc levels: Severe disc space narrowing at all levels of the cervical spine most prominent at the C5-6 and C6-7 levels. Upper chest: Calcified plaque over the aortic arch. Other: None. IMPRESSION: No acute brain injury. Mild atrophy and chronic ischemic microvascular disease. No acute cervical spine injury. Moderate spondylosis of the cervical spine with significant multilevel disc disease throughout the lower cervical spine and mild  bilateral neural foraminal narrowing right worse than left over several levels of the mid to lower cervical spine. Aortic Atherosclerosis (ICD10-I70.0). Mild chronic sinus inflammatory change. Electronically Signed   By: Marin Olp M.D.   On: 03/22/2018 11:21   Ct Cervical Spine Wo Contrast  Result Date: 03/22/2018 CLINICAL DATA:  Fall this morning. Posterior neck pain. No headache. Brief loss of conscious. EXAM: CT HEAD WITHOUT CONTRAST CT CERVICAL SPINE WITHOUT CONTRAST TECHNIQUE: Multidetector CT imaging of the head and cervical spine was performed following the standard protocol without intravenous contrast. Multiplanar CT image reconstructions of the cervical spine were also generated. COMPARISON:  None. FINDINGS: CT HEAD FINDINGS Brain: Examination demonstrates mild age related atrophic change and minimal chronic ischemic microvascular disease. There is no mass, mass effect, shift of midline structures or acute hemorrhage. No evidence of acute infarction. Vascular: No hyperdense vessel or unexpected calcification. Skull: Normal. Negative for fracture or focal lesion. Sinuses/Orbits: Orbits are normal. Paranasal sinuses are well developed and demonstrate mild mucosal membrane thickening over the floor the maxillary sinuses. Mastoid air cells are clear. Other: None. CT CERVICAL SPINE FINDINGS Alignment: Subtle 2 mm anterior subluxation of C7 on T1 likely degenerative in nature due to moderate associated facet arthropathy. No traumatic subluxation. Skull base and vertebrae: Vertebral body heights are normal. There is moderate spondylosis throughout the cervical spine. Atlantoaxial articulation is unremarkable. There is moderate uncovertebral joint spurring and facet arthropathy. No acute fracture. Mild bilateral neural foraminal narrowing right worse than left over several levels of the mid to lower cervical spine. Soft tissues and spinal canal: No prevertebral fluid or swelling. No visible canal  hematoma. Disc levels: Severe disc space narrowing at all levels of the cervical spine most prominent at the C5-6 and C6-7 levels. Upper chest: Calcified plaque over the aortic arch. Other: None. IMPRESSION: No acute brain injury. Mild atrophy and chronic ischemic microvascular disease. No acute cervical spine injury. Moderate spondylosis of the cervical spine with significant multilevel disc disease throughout the lower cervical spine and mild bilateral neural foraminal narrowing right worse than left over several levels of the mid to lower cervical spine. Aortic Atherosclerosis (ICD10-I70.0). Mild chronic sinus inflammatory change. Electronically Signed   By: Marin Olp M.D.   On: 03/22/2018 11:21    Procedures Procedures (including critical care time)  Medications Ordered in ED Medications  0.9 %  sodium chloride infusion (125 mL/hr Intravenous New Bag/Given 03/22/18 0946)  fentaNYL (SUBLIMAZE) injection 25 mcg (  0 mcg Intravenous Hold 03/22/18 1220)  acetaminophen (TYLENOL) tablet 500 mg (500 mg Oral Given 03/22/18 1218)     Initial Impression / Assessment and Plan / ED Course  I have reviewed the triage vital signs and the nursing notes.  Pertinent labs & imaging results that were available during my care of the patient were reviewed by me and considered in my medical decision making (see chart for details).     12:40 PM On repeat exam the patient is awake and alert, now feels slightly better after receiving Tylenol, fentanyl for his neck pain. I reviewed all findings, discussed them with the patient and his daughter. Patient's blood pressure is unremarkable, he continues to feel mild lightheadedness, and given his episode of syncope, slight elevation in creatinine, suspicion for volume status related episode. No early evidence for neurogenic, neurocardiogenic syncope. However, given his age, volume status, patient will be admitted for further evaluation and monitoring.  Final  Clinical Impressions(s) / ED Diagnoses  Syncope and collapse   Carmin Muskrat, MD 03/22/18 1240

## 2018-03-22 NOTE — ED Notes (Signed)
Pt was unable to use bedpan. Pt stated he will try again at a later time. RN notified.

## 2018-03-23 ENCOUNTER — Observation Stay (HOSPITAL_BASED_OUTPATIENT_CLINIC_OR_DEPARTMENT_OTHER): Payer: Medicare Other

## 2018-03-23 DIAGNOSIS — I370 Nonrheumatic pulmonary valve stenosis: Secondary | ICD-10-CM

## 2018-03-23 DIAGNOSIS — W19XXXA Unspecified fall, initial encounter: Secondary | ICD-10-CM | POA: Diagnosis present

## 2018-03-23 DIAGNOSIS — I361 Nonrheumatic tricuspid (valve) insufficiency: Secondary | ICD-10-CM

## 2018-03-23 DIAGNOSIS — I371 Nonrheumatic pulmonary valve insufficiency: Secondary | ICD-10-CM | POA: Diagnosis present

## 2018-03-23 DIAGNOSIS — J069 Acute upper respiratory infection, unspecified: Secondary | ICD-10-CM | POA: Diagnosis present

## 2018-03-23 DIAGNOSIS — R55 Syncope and collapse: Secondary | ICD-10-CM | POA: Diagnosis present

## 2018-03-23 DIAGNOSIS — Z66 Do not resuscitate: Secondary | ICD-10-CM | POA: Diagnosis present

## 2018-03-23 DIAGNOSIS — F329 Major depressive disorder, single episode, unspecified: Secondary | ICD-10-CM | POA: Diagnosis present

## 2018-03-23 DIAGNOSIS — N400 Enlarged prostate without lower urinary tract symptoms: Secondary | ICD-10-CM | POA: Diagnosis present

## 2018-03-23 DIAGNOSIS — M47812 Spondylosis without myelopathy or radiculopathy, cervical region: Secondary | ICD-10-CM | POA: Diagnosis present

## 2018-03-23 DIAGNOSIS — M810 Age-related osteoporosis without current pathological fracture: Secondary | ICD-10-CM | POA: Diagnosis present

## 2018-03-23 DIAGNOSIS — Z96653 Presence of artificial knee joint, bilateral: Secondary | ICD-10-CM | POA: Diagnosis present

## 2018-03-23 DIAGNOSIS — E785 Hyperlipidemia, unspecified: Secondary | ICD-10-CM | POA: Diagnosis present

## 2018-03-23 DIAGNOSIS — Z791 Long term (current) use of non-steroidal anti-inflammatories (NSAID): Secondary | ICD-10-CM | POA: Diagnosis not present

## 2018-03-23 DIAGNOSIS — F0390 Unspecified dementia without behavioral disturbance: Secondary | ICD-10-CM | POA: Diagnosis present

## 2018-03-23 DIAGNOSIS — R197 Diarrhea, unspecified: Secondary | ICD-10-CM | POA: Diagnosis present

## 2018-03-23 DIAGNOSIS — N179 Acute kidney failure, unspecified: Secondary | ICD-10-CM | POA: Diagnosis present

## 2018-03-23 DIAGNOSIS — Z79899 Other long term (current) drug therapy: Secondary | ICD-10-CM | POA: Diagnosis not present

## 2018-03-23 DIAGNOSIS — E860 Dehydration: Secondary | ICD-10-CM | POA: Diagnosis present

## 2018-03-23 LAB — BASIC METABOLIC PANEL
Anion gap: 6 (ref 5–15)
BUN: 10 mg/dL (ref 8–23)
CO2: 27 mmol/L (ref 22–32)
Calcium: 7.9 mg/dL — ABNORMAL LOW (ref 8.9–10.3)
Chloride: 110 mmol/L (ref 98–111)
Creatinine, Ser: 1.01 mg/dL (ref 0.61–1.24)
GFR calc Af Amer: >60 mL/min (ref >60)
GFR calc non Af Amer: >60 mL/min (ref >60)
Glucose, Bld: 90 mg/dL (ref 70–99)
Potassium: 3.8 mmol/L (ref 3.5–5.1)
Sodium: 143 mmol/L (ref 135–145)

## 2018-03-23 LAB — ECHOCARDIOGRAM COMPLETE
Height: 72 in
Weight: 2627.88 oz

## 2018-03-23 LAB — GLUCOSE, CAPILLARY: Glucose-Capillary: 81 mg/dL (ref 70–99)

## 2018-03-23 LAB — C DIFFICILE QUICK SCREEN W PCR REFLEX
C Diff antigen: NEGATIVE
C Diff interpretation: NOT DETECTED
C Diff toxin: NEGATIVE

## 2018-03-23 MED ORDER — POLYVINYL ALCOHOL 1.4 % OP SOLN
2.0000 [drp] | OPHTHALMIC | Status: DC | PRN
  Filled 2018-03-23: qty 15

## 2018-03-23 NOTE — H&P (Signed)
History and Physical    Normal Recinos KXF:[id number] DOB: 06/20/1928 DOA: 03/22/2018  PCP: Antony Contras, MD (Confirm with patient/family/NH records and if not entered, this has to be entered at [facility] point of entry) Patient coming from: home admit from Observation status  I have personally briefly reviewed patient's old medical records in [place]  Chief Complaint: syncope and weakness   HPI:  Steven Mullen is a 84 y.o. male with a history of osteoporosis, mild dementia on Aricept, leg weakness.  Patient seen for syncope and collapse that happened earlier today.  Patient has been on cold medicines for viral upper respiratory infection.  He has been taking Alka-Seltzer.  He had a sudden labs after standing up.  Denies chest pain before or after event.  He did lose consciousness for several moments.  This is never happened to him before.  He still feels fairly weak.  Additionally, the patient did have an episode of diarrhea shortly before EMS arrived.  He did have more loose stool in the ED.  Emergency Department Course: Blood work revealed a creatinine of 1.2.  Baseline between 0.9 and 1.0.  Patient had IV fluids started.  CT was negative for acute head trauma  Observation course:  Despite IVF pt remains orthostatic today.  Suspect GI virus versus hypotension causing syncope.  Will admit pt to hospital for further evaluation and treatment   Review of Systems: As per HPI otherwise all other systems reviewed and  negative.   Past Medical History:  Diagnosis Date  . BPH (benign prostatic hyperplasia)   . History of knee replacement   . Hyperlipidemia   . Osteoporosis   . Quadriceps weakness     Past Surgical History:  Procedure Laterality Date  . REPLACEMENT TOTAL KNEE BILATERAL      Social History   Social History Narrative  . Not on file     reports that he has never smoked. He has never used smokeless tobacco. He reports previous alcohol use. No history on file for  drug.  No Known Allergies  History reviewed. No pertinent family history. No family history of orthostasis  Prior to Admission medications   Medication Sig Start Date End Date Taking? Authorizing Provider  buPROPion (WELLBUTRIN XL) 150 MG 24 hr tablet Take 150 mg by mouth daily. 11/18/17  Yes [provider]  Calcium Carbonate-Vitamin D (CALCIUM 500 + D) 500-125 MG-UNIT TABS Take 1 tablet by mouth daily.   Yes [provider]  donepezil (ARICEPT) 5 MG tablet Take 5 mg by mouth at bedtime. 11/17/17  Yes [provider]  ibuprofen (ADVIL,MOTRIN) 200 MG tablet Take 400 mg by mouth every 6 (six) hours as needed for moderate pain (for back).   Yes [provider]  Melatonin 1 MG TABS Take 3 mg by mouth at bedtime.   Yes [provider]  ondansetron (ZOFRAN) 4 MG tablet Take 4 mg by mouth every 8 (eight) hours as needed for nausea or vomiting.   Yes [provider]  Vitamin D, Ergocalciferol, (DRISDOL) 1.25 MG (50000 UT) CAPS capsule Take 50,000 capsules by mouth every Friday.   Yes [provider]  OVER THE COUNTER MEDICATION Take 2 tablets by mouth as needed. Alka seltzer cold/night    [provider]    Physical Exam:  Constitutional: NAD, calm, comfortable, weak and ill appearing Vitals:   03/23/18 0029 03/23/18 0500 03/23/18 0605 03/23/18 1259  BP: 115/76  108/65 102/67  Pulse: 64  (!) 56 (!)  58  Resp: 14  18 18   Temp: 98.7 F (37.1 C)  98.7 F (37.1 C)   TempSrc: Oral  Oral   SpO2: 91%  93% 95%  Weight:  74.5 kg    Height:       Eyes: PERRL, lids and conjunctivae normal ENMT: Mucous membranes are moist. Posterior pharynx clear of any exudate or lesions.Normal dentition.  Neck: normal, supple, no masses, no thyromegaly Respiratory: clear to auscultation bilaterally, no wheezing, no crackles. Normal respiratory effort. No accessory muscle use.  Cardiovascular: Regular rate and rhythm, no murmurs / rubs /  gallops. No extremity edema. 2+ pedal pulses. No carotid bruits.  Abdomen: no tenderness, no masses palpated. No hepatosplenomegaly. Bowel sounds positive.  Musculoskeletal: no clubbing / cyanosis. No joint deformity upper and lower extremities. Good ROM, no contractures. Normal muscle tone.  Skin: no rashes, lesions, ulcers. No induration Neurologic: CN 2-12 grossly intact. Sensation intact, DTR normal. Strength 5/5 in all 4.  Psychiatric: Normal judgment and insight. Alert and oriented x 3. Normal mood.    Labs on Admission: I have personally reviewed following labs and imaging studies  CBC: Recent Labs  Lab 03/22/18 0857  WBC 8.2  NEUTROABS 6.4  HGB 12.8*  HCT 40.3  MCV 105.5*  PLT 412*   Basic Metabolic Panel: Recent Labs  Lab 03/22/18 0857 03/23/18 0530  NA 141 143  K 4.1 3.8  CL 109 110  CO2 21* 27  GLUCOSE 86 90  BUN 15 10  CREATININE 1.29* 1.01  CALCIUM 8.0* 7.9*   GFR: Estimated Creatinine Clearance: 54.3 mL/min (by C-G formula based on SCr of 1.01 mg/dL). Liver Function Tests: Recent Labs  Lab 03/22/18 0857  AST 19  ALT 10  ALKPHOS 44  BILITOT 0.8  PROT 5.4*  ALBUMIN 3.0*   Cardiac Enzymes: Recent Labs  Lab 03/22/18 0857  TROPONINI <0.03   CBG: Recent Labs  Lab 03/23/18 0619  GLUCAP 81    Radiological Exams on Admission: Dg Chest 2 View  Result Date: 03/22/2018 CLINICAL DATA:  Syncopal episode with fall, initial encounter EXAM: CHEST - 2 VIEW COMPARISON:  None. FINDINGS: Cardiac shadows within normal limits. Aortic calcifications are identified. The lungs are well aerated bilaterally without focal infiltrate or sizable effusion. No bony abnormality is noted. Calcified granuloma is noted on the lateral projection anteriorly. IMPRESSION: No acute abnormality noted. Electronically Signed   By: Inez Catalina M.D.   On: 03/22/2018 09:19   Ct Head Wo Contrast  Result Date: 03/22/2018 CLINICAL DATA:  Fall this morning. Posterior neck pain. No  headache. Brief loss of conscious. EXAM: CT HEAD WITHOUT CONTRAST CT CERVICAL SPINE WITHOUT CONTRAST TECHNIQUE: Multidetector CT imaging of the head and cervical spine was performed following the standard protocol without intravenous contrast. Multiplanar CT image reconstructions of the cervical spine were also generated. COMPARISON:  None. FINDINGS: CT HEAD FINDINGS Brain: Examination demonstrates mild age related atrophic change and minimal chronic ischemic microvascular disease. There is no mass, mass effect, shift of midline structures or acute hemorrhage. No evidence of acute infarction. Vascular: No hyperdense vessel or unexpected calcification. Skull: Normal. Negative for fracture or focal lesion. Sinuses/Orbits: Orbits are normal. Paranasal sinuses are well developed and demonstrate mild mucosal membrane thickening over the floor the maxillary sinuses. Mastoid air cells are clear. Other: None. CT CERVICAL SPINE FINDINGS Alignment: Subtle 2 mm anterior subluxation of C7 on T1 likely degenerative in nature due to moderate associated facet arthropathy. No traumatic subluxation. Skull base and vertebrae:  Vertebral body heights are normal. There is moderate spondylosis throughout the cervical spine. Atlantoaxial articulation is unremarkable. There is moderate uncovertebral joint spurring and facet arthropathy. No acute fracture. Mild bilateral neural foraminal narrowing right worse than left over several levels of the mid to lower cervical spine. Soft tissues and spinal canal: No prevertebral fluid or swelling. No visible canal hematoma. Disc levels: Severe disc space narrowing at all levels of the cervical spine most prominent at the C5-6 and C6-7 levels. Upper chest: Calcified plaque over the aortic arch. Other: None. IMPRESSION: No acute brain injury. Mild atrophy and chronic ischemic microvascular disease. No acute cervical spine injury. Moderate spondylosis of the cervical spine with significant multilevel  disc disease throughout the lower cervical spine and mild bilateral neural foraminal narrowing right worse than left over several levels of the mid to lower cervical spine. Aortic Atherosclerosis (ICD10-I70.0). Mild chronic sinus inflammatory change. Electronically Signed   By: Marin Olp M.D.   On: 03/22/2018 11:21   Ct Cervical Spine Wo Contrast  Result Date: 03/22/2018 CLINICAL DATA:  Fall this morning. Posterior neck pain. No headache. Brief loss of conscious. EXAM: CT HEAD WITHOUT CONTRAST CT CERVICAL SPINE WITHOUT CONTRAST TECHNIQUE: Multidetector CT imaging of the head and cervical spine was performed following the standard protocol without intravenous contrast. Multiplanar CT image reconstructions of the cervical spine were also generated. COMPARISON:  None. FINDINGS: CT HEAD FINDINGS Brain: Examination demonstrates mild age related atrophic change and minimal chronic ischemic microvascular disease. There is no mass, mass effect, shift of midline structures or acute hemorrhage. No evidence of acute infarction. Vascular: No hyperdense vessel or unexpected calcification. Skull: Normal. Negative for fracture or focal lesion. Sinuses/Orbits: Orbits are normal. Paranasal sinuses are well developed and demonstrate mild mucosal membrane thickening over the floor the maxillary sinuses. Mastoid air cells are clear. Other: None. CT CERVICAL SPINE FINDINGS Alignment: Subtle 2 mm anterior subluxation of C7 on T1 likely degenerative in nature due to moderate associated facet arthropathy. No traumatic subluxation. Skull base and vertebrae: Vertebral body heights are normal. There is moderate spondylosis throughout the cervical spine. Atlantoaxial articulation is unremarkable. There is moderate uncovertebral joint spurring and facet arthropathy. No acute fracture. Mild bilateral neural foraminal narrowing right worse than left over several levels of the mid to lower cervical spine. Soft tissues and spinal canal: No  prevertebral fluid or swelling. No visible canal hematoma. Disc levels: Severe disc space narrowing at all levels of the cervical spine most prominent at the C5-6 and C6-7 levels. Upper chest: Calcified plaque over the aortic arch. Other: None. IMPRESSION: No acute brain injury. Mild atrophy and chronic ischemic microvascular disease. No acute cervical spine injury. Moderate spondylosis of the cervical spine with significant multilevel disc disease throughout the lower cervical spine and mild bilateral neural foraminal narrowing right worse than left over several levels of the mid to lower cervical spine. Aortic Atherosclerosis (ICD10-I70.0). Mild chronic sinus inflammatory change. Electronically Signed   By: Marin Olp M.D.   On: 03/22/2018 11:21    EKG: Independently reviewed. Sinus rhythm with slightly prolonged PR interval.  Right bundle branch block.  No acute ST changes  Assessment/Plan Principal Problem:   Syncope Active Problems:   Osteoporosis   Acute renal injury (HCC)   Dehydration 1. Syncope a. Admit on telemetry b. IV hydration c. It appears that the patient's EKG is relatively unchanged from the description in May in 2019.  Unfortunately, I am unable to view the actual EKG. d. Repeat BMP  in the morning e. Echo tomorrow if telemetry monitoring shows any arrhythmias. f. Orthostatics 2. Dehydration a. Rehydrate b. Check stool for C. Diff and viral studies 3. Acute renal injury a. Recheck creatinine in the morning after rehydration 4. Osteoporosis  DVT prophylaxis: Lovenox Consultants: None Code Status: DNR Family Communication: Daughter present during interview and exam Disposition Plan: Admit to inpatient status ans cont treatment.  Patient should be able to return home following improvement    Lady Deutscher MD [facility] Pager [phone]  If 7PM-7AM, please contact night-coverage [email or web address] Password [email or web address]  03/23/2018, 4:40 PM

## 2018-03-23 NOTE — Progress Notes (Signed)
Patient transferred from [facility] observation to room 5M10. Patient is alert and oriented x 4. Denies pain at this time. Dorthey Sawyer, RN

## 2018-03-23 NOTE — Progress Notes (Signed)
  Echocardiogram 2D Echocardiogram has been performed.  Jenicka Coxe T Miriam Liles 03/23/2018, 12:54 PM

## 2018-03-24 LAB — GASTROINTESTINAL PANEL BY PCR, STOOL (REPLACES STOOL CULTURE)

## 2018-03-24 NOTE — Care Management Note (Signed)
Case Management Note  Patient Details  Name: Steven Mullen MRN: [id number] Date of Birth: 11-25-1928  Subjective/Objective:                    Action/Plan:  Spoke w patient at bedside. He would like to use [facility] for [facility] services, referral accepted by [facility]. No other CM needs identified.    Expected Discharge Date:  03/24/18               Expected Discharge Plan:  [place]  In-House Referral:     Discharge planning Services  CM Consult  Post Acute Care Choice:  Home Health Choice offered to:  Patient  DME Arranged:    DME Agency:     HH Arranged:  PT [place]:  [place]  Status of Service:  Completed, signed off  If discussed at [place] of Stay Meetings, dates discussed:    Additional Comments:  Carles Collet, RN 03/24/2018, 1:40 PM

## 2018-03-24 NOTE — Discharge Summary (Signed)
Physician Discharge Summary  Steven Mullen JXB:[id number] DOB: 1929-01-08 DOA: 03/22/2018  PCP: Antony Contras, MD  Admit date: 03/22/2018 Discharge date: 03/25/2018  Time spent: 45 minutes  Recommendations for Outpatient Follow-up:  Patient will be discharged to home with home health physical and occupational therapy.  Patient will need to follow up with primary care provider within Mullen week of discharge, repeat BMP.  Patient should continue medications as prescribed.  Patient should follow a regular diet.   Discharge Diagnoses:  Syncope Acute kidney injury with dehydration Deconditioning Osteoporosis Mild dementia Diarrhea  Discharge Condition: Stable  Diet recommendation: regular  Filed Weights   03/22/18 1941 03/23/18 0500 03/23/18 2117  Weight: 73.4 kg 74.5 kg 74.4 kg    History of present illness:  On 03/22/2018 by Dr. Loma Boston Rabon Mullen is a 84 y.o. male with a history of osteoporosis, mild dementia on Aricept, leg weakness.  Patient seen for syncope and collapse that happened earlier today.  Patient has been on cold medicines for viral upper respiratory infection.  He has been taking Alka-Seltzer.  He had a sudden labs after standing up.  Denies chest pain before or after event.  He did lose consciousness for several moments.  This is never happened to him before.  He still feels fairly weak.  Additionally, the patient did have an episode of diarrhea shortly before EMS arrived.  He did have more loose stool in the ED.  Hospital Course:  Syncope -Suspect secondary to dehydration, vasovagal -EKG reviewed and unchanged from May 2019 -Echocardiogram EF of 60 to 65%, Steven regional wall motion abnormalities.  LV diastolic function parameters normal.  Trivial aortic regurgitation.  Steven defect or patent foramen ovale.  Mild tricuspid and pulmonic regurgitation.  PA peak pressure 37 mmHg. -Orthostatic vitals appear to be unremarkable -Patient did receive IV  fluids  Acute kidney injury with dehydration -Patient's creatinine on admission was 1.29.  Baseline creatinine appears to be 0.9-1. -Patient was placed on IV fluids -Repeat BMP in 1 week  Deconditioning -Discussed with patient's son and patient at bedside, he has been battling an upper respiratory infection for approximately 1 week.  Suspect deconditioning is likely due to recent cold. -PT consulted and recommended HH  Osteoporosis -Follow-up with PCP -Continue calcium with vitamin D supplementation  Mild dementia -Currently alert and oriented x3 -Continue Aricept  Depression -Continue Wellbutrin  Diarrhea -Suspect secondary to viral cause -C. difficile PCR unremarkable -Patient now having formed stools -GI pathogen panel negative  Procedures: Echocardiogram  Consultations: None  Discharge Exam: Vitals:   03/23/18 2117 03/24/18 0921  BP: 126/78 111/75  Pulse: (!) 54 (!) 53  Resp: 19 18  Temp: 98.4 F (36.9 C) 97.9 F (36.6 C)  SpO2: 98% 94%   Patient has Steven complaints this morning.  Wanting to go home today.  Son also states that patient recently had an upper respiratory infection and has been getting over that.  Feels that his strength has improved since being in the hospital.  Currently denies chest pain, shortness breath, abdominal pain, nausea or vomiting, diarrhea constipation, dizziness or headache.   General: Well developed, well nourished, NAD, appears stated age  23: NCAT, mucous membranes moist.  Neck: Supple  Cardiovascular: S1 S2 auscultated, RRR, Steven murmur  Respiratory: Clear to auscultation bilaterally with equal chest rise  Abdomen: Soft, nontender, nondistended, + bowel sounds  Extremities: warm dry without cyanosis clubbing or edema  Neuro: AAOx3, nonfocal  Skin: Without rashes exudates or nodules  Psych: Normal  affect and demeanor   Discharge Instructions Discharge Instructions    Discharge instructions   Complete by:  As  directed    Patient will be discharged to home with home health physical and occupational therapy.  Patient will need to follow up with primary care provider within Mullen week of discharge, repeat BMP.  Patient should continue medications as prescribed.  Patient should follow a regular diet.     Allergies as of 03/24/2018   Steven Known Allergies     Medication List    STOP taking these medications   ibuprofen 200 MG tablet Commonly known as:  ADVIL,MOTRIN     TAKE these medications   buPROPion 150 MG 24 hr tablet Commonly known as:  WELLBUTRIN XL Take 150 mg by mouth daily.   CALCIUM 500 + D 500-125 MG-UNIT Tabs Generic drug:  Calcium Carbonate-Vitamin D Take 1 tablet by mouth daily.   donepezil 5 MG tablet Commonly known as:  ARICEPT Take 5 mg by mouth at bedtime.   Melatonin 1 MG Tabs Take 3 mg by mouth at bedtime.   ondansetron 4 MG tablet Commonly known as:  ZOFRAN Take 4 mg by mouth every 8 (eight) hours as needed for nausea or vomiting.   OVER THE COUNTER MEDICATION Take 2 tablets by mouth as needed. Alka seltzer cold/night   Vitamin D (Ergocalciferol) 1.25 MG (50000 UT) Caps capsule Commonly known as:  DRISDOL Take 50,000 capsules by mouth every Friday.      Steven Known Allergies Follow-up Information    Antony Contras, MD. Schedule an appointment as soon as possible for a visit in 1 week(s).   Specialty:  Family Medicine Why:  Hospital follow up Contact information: [address] [place] [address] [facility] Follow up.   Specialty:  Home Health Services Why:  For home health services, they will call you in 1-2 days to set up your first home visit.  Contact information: [address] [place] [address] [phone]            The results of significant diagnostics from this hospitalization (including imaging, microbiology, ancillary and laboratory) are listed below for  reference.    Significant Diagnostic Studies: Dg Chest 2 View  Result Date: 03/22/2018 CLINICAL DATA:  Syncopal episode with fall, initial encounter EXAM: CHEST - 2 VIEW COMPARISON:  None. FINDINGS: Cardiac shadows within normal limits. Aortic calcifications are identified. The lungs are well aerated bilaterally without focal infiltrate or sizable effusion. Steven bony abnormality is noted. Calcified granuloma is noted on the lateral projection anteriorly. IMPRESSION: Steven acute abnormality noted. Electronically Signed   By: Inez Catalina M.D.   On: 03/22/2018 09:19   Ct Head Wo Contrast  Result Date: 03/22/2018 CLINICAL DATA:  Fall this morning. Posterior neck pain. Steven headache. Brief loss of conscious. EXAM: CT HEAD WITHOUT CONTRAST CT CERVICAL SPINE WITHOUT CONTRAST TECHNIQUE: Multidetector CT imaging of the head and cervical spine was performed following the standard protocol without intravenous contrast. Multiplanar CT image reconstructions of the cervical spine were also generated. COMPARISON:  None. FINDINGS: CT HEAD FINDINGS Brain: Examination demonstrates mild age related atrophic change and minimal chronic ischemic microvascular disease. There is Steven mass, mass effect, shift of midline structures or acute hemorrhage. Steven evidence of acute infarction. Vascular: Steven hyperdense vessel or unexpected calcification. Skull: Normal. Negative for fracture or focal lesion. Sinuses/Orbits: Orbits are normal. Paranasal sinuses are well developed and  demonstrate mild mucosal membrane thickening over the floor the maxillary sinuses. Mastoid air cells are clear. Other: None. CT CERVICAL SPINE FINDINGS Alignment: Subtle 2 mm anterior subluxation of C7 on T1 likely degenerative in nature due to moderate associated facet arthropathy. Steven traumatic subluxation. Skull base and vertebrae: Vertebral body heights are normal. There is moderate spondylosis throughout the cervical spine. Atlantoaxial articulation is unremarkable.  There is moderate uncovertebral joint spurring and facet arthropathy. Steven acute fracture. Mild bilateral neural foraminal narrowing right worse than left over several levels of the mid to lower cervical spine. Soft tissues and spinal canal: Steven prevertebral fluid or swelling. Steven visible canal hematoma. Disc levels: Severe disc space narrowing at all levels of the cervical spine most prominent at the C5-6 and C6-7 levels. Upper chest: Calcified plaque over the aortic arch. Other: None. IMPRESSION: Steven acute brain injury. Mild atrophy and chronic ischemic microvascular disease. Steven acute cervical spine injury. Moderate spondylosis of the cervical spine with significant multilevel disc disease throughout the lower cervical spine and mild bilateral neural foraminal narrowing right worse than left over several levels of the mid to lower cervical spine. Aortic Atherosclerosis (ICD10-I70.0). Mild chronic sinus inflammatory change. Electronically Signed   By: Marin Olp M.D.   On: 03/22/2018 11:21   Ct Cervical Spine Wo Contrast  Result Date: 03/22/2018 CLINICAL DATA:  Fall this morning. Posterior neck pain. Steven headache. Brief loss of conscious. EXAM: CT HEAD WITHOUT CONTRAST CT CERVICAL SPINE WITHOUT CONTRAST TECHNIQUE: Multidetector CT imaging of the head and cervical spine was performed following the standard protocol without intravenous contrast. Multiplanar CT image reconstructions of the cervical spine were also generated. COMPARISON:  None. FINDINGS: CT HEAD FINDINGS Brain: Examination demonstrates mild age related atrophic change and minimal chronic ischemic microvascular disease. There is Steven mass, mass effect, shift of midline structures or acute hemorrhage. Steven evidence of acute infarction. Vascular: Steven hyperdense vessel or unexpected calcification. Skull: Normal. Negative for fracture or focal lesion. Sinuses/Orbits: Orbits are normal. Paranasal sinuses are well developed and demonstrate mild mucosal membrane  thickening over the floor the maxillary sinuses. Mastoid air cells are clear. Other: None. CT CERVICAL SPINE FINDINGS Alignment: Subtle 2 mm anterior subluxation of C7 on T1 likely degenerative in nature due to moderate associated facet arthropathy. Steven traumatic subluxation. Skull base and vertebrae: Vertebral body heights are normal. There is moderate spondylosis throughout the cervical spine. Atlantoaxial articulation is unremarkable. There is moderate uncovertebral joint spurring and facet arthropathy. Steven acute fracture. Mild bilateral neural foraminal narrowing right worse than left over several levels of the mid to lower cervical spine. Soft tissues and spinal canal: Steven prevertebral fluid or swelling. Steven visible canal hematoma. Disc levels: Severe disc space narrowing at all levels of the cervical spine most prominent at the C5-6 and C6-7 levels. Upper chest: Calcified plaque over the aortic arch. Other: None. IMPRESSION: Steven acute brain injury. Mild atrophy and chronic ischemic microvascular disease. Steven acute cervical spine injury. Moderate spondylosis of the cervical spine with significant multilevel disc disease throughout the lower cervical spine and mild bilateral neural foraminal narrowing right worse than left over several levels of the mid to lower cervical spine. Aortic Atherosclerosis (ICD10-I70.0). Mild chronic sinus inflammatory change. Electronically Signed   By: Marin Olp M.D.   On: 03/22/2018 11:21    Microbiology: Recent Results (from the past 240 hour(s))  C difficile quick scan w PCR reflex     Status: None   Collection Time: 03/23/18  2:58 PM  Result Value Ref Range Status   C Diff antigen NEGATIVE NEGATIVE Final   C Diff toxin NEGATIVE NEGATIVE Final   C Diff interpretation Steven C. difficile detected.  Final    Comment: Performed at [facility], [place] [address] [place], [place] [address]  Gastrointestinal Panel by PCR , Stool     Status: None   Collection Time: 03/23/18   2:58 PM  Result Value Ref Range Status   Campylobacter species NOT DETECTED NOT DETECTED Final   Plesimonas shigelloides NOT DETECTED NOT DETECTED Final   Salmonella species NOT DETECTED NOT DETECTED Final   Yersinia enterocolitica NOT DETECTED NOT DETECTED Final   Vibrio species NOT DETECTED NOT DETECTED Final   Vibrio cholerae NOT DETECTED NOT DETECTED Final   Enteroaggregative E coli (EAEC) NOT DETECTED NOT DETECTED Final   Enteropathogenic E coli (EPEC) NOT DETECTED NOT DETECTED Final   Enterotoxigenic E coli (ETEC) NOT DETECTED NOT DETECTED Final   Shiga like toxin producing E coli (STEC) NOT DETECTED NOT DETECTED Final   Shigella/Enteroinvasive E coli (EIEC) NOT DETECTED NOT DETECTED Final   Cryptosporidium NOT DETECTED NOT DETECTED Final   Cyclospora cayetanensis NOT DETECTED NOT DETECTED Final   Entamoeba histolytica NOT DETECTED NOT DETECTED Final   Giardia lamblia NOT DETECTED NOT DETECTED Final   Adenovirus F40/41 NOT DETECTED NOT DETECTED Final   Astrovirus NOT DETECTED NOT DETECTED Final   Norovirus GI/GII NOT DETECTED NOT DETECTED Final   Rotavirus A NOT DETECTED NOT DETECTED Final   Sapovirus (I, II, IV, and V) NOT DETECTED NOT DETECTED Final    Comment: Performed at [facility], [place] [place],  [address]     Labs: Basic Metabolic Panel: Recent Labs  Lab 03/22/18 0857 03/23/18 0530  NA 141 143  K 4.1 3.8  CL 109 110  CO2 21* 27  GLUCOSE 86 90  BUN 15 10  CREATININE 1.29* 1.01  CALCIUM 8.0* 7.9*   Liver Function Tests: Recent Labs  Lab 03/22/18 0857  AST 19  ALT 10  ALKPHOS 44  BILITOT 0.8  PROT 5.4*  ALBUMIN 3.0*   Steven results for input(s): LIPASE, AMYLASE in the last 168 hours. Steven results for input(s): AMMONIA in the last 168 hours. CBC: Recent Labs  Lab 03/22/18 0857  WBC 8.2  NEUTROABS 6.4  HGB 12.8*  HCT 40.3  MCV 105.5*  PLT 144*   Cardiac Enzymes: Recent Labs  Lab 03/22/18 0857  TROPONINI <0.03    BNP: BNP (last 3 results) Steven results for input(s): BNP in the last 8760 hours.  ProBNP (last 3 results) Steven results for input(s): PROBNP in the last 8760 hours.  CBG: Recent Labs  Lab 03/23/18 0619  GLUCAP 81       Signed:  Kenyatta Keidel  [facility] 03/25/2018, 7:13 AM

## 2018-03-24 NOTE — Evaluation (Signed)
Physical Therapy Evaluation Patient Details Name: Steven Mullen MRN: [id number] DOB: December 11, 1928 Today's Date: 03/24/2018   History of Present Illness    83 y.o.malewith a history of osteoporosis, mild dementia on Aricept, leg weakness. Patient seen for syncope and collapse that happened earlier today. Patient has been on cold medicines for viral upper respiratory infection. He has been taking Alka-Seltzer. He had a sudden labs after standing up. Denies chest pain before or after event. He did lose consciousness for several moments. This is never happened to him before. He still feels fairly weak. Additionally, the patient did have an episode of diarrhea shortly before EMS arrived. He did have more loose stool in the ED.   Clinical Impression  Pt presents with mild limits to functional mobility due to weakness, limited ROM, balance deficits resulting in dependencies in gait.  Pt will benefit from PT to address deficits in order to promote functional independence at home.  Recommend HHPT services to address needs. No further acute PT needs.     Follow Up Recommendations Home health PT    Equipment Recommendations  None recommended by PT    Recommendations for Other Services       Precautions / Restrictions Precautions Precautions: Fall Precaution Comments: history Restrictions Weight Bearing Restrictions: No      Mobility  Bed Mobility Overal bed mobility: Modified Independent             General bed mobility comments: gets to/from EOB unassisted including covers  Transfers Overall transfer level: Modified independent Equipment used: Rolling walker (2 wheeled)             General transfer comment: initally cues for safest hands, then pt performs independently with some incr effort, from low bed and no issues to RW  Ambulation/Gait Ambulation/Gait assistance: Supervision Gait Distance (Feet): 250 Feet Assistive device: Rolling walker (2 wheeled) Gait  Pattern/deviations: Step-through pattern;Shuffle;Decreased stride length;Narrow base of support;Trunk flexed Gait velocity: slow, unmeasured   General Gait Details: cues to correct posture and proximity to RW, no s/s presyncope or orthstasis after inital transient dizziness upon upright  Stairs            Wheelchair Mobility    Modified Rankin (Stroke Patients Only)       Balance Overall balance assessment: Needs assistance;History of Falls Sitting-balance support: No upper extremity supported;Feet supported Sitting balance-Leahy Scale: Good     Standing balance support: During functional activity;Bilateral upper extremity supported Standing balance-Leahy Scale: Fair                               Pertinent Vitals/Pain      Home Living Family/patient expects to be discharged to:: Private residence Living Arrangements: Children Available Help at Discharge: Available 24 hours/day Type of Home: House Home Access: Stairs to enter Entrance Stairs-Rails: None Entrance Stairs-Number of Steps: 1 Home Layout: One level Home Equipment: Environmental consultant - 2 wheels;Cane - single point Additional Comments: uses cane mostly (90%) but agrees to use RW while recovering    Prior Function Level of Independence: Independent with assistive device(s)               Hand Dominance        Extremity/Trunk Assessment   Upper Extremity Assessment Upper Extremity Assessment: Generalized weakness    Lower Extremity Assessment Lower Extremity Assessment: Generalized weakness    Cervical / Trunk Assessment Cervical / Trunk Assessment: Kyphotic  Communication   Communication: No  difficulties  Cognition Arousal/Alertness: Awake/alert Behavior During Therapy: WFL for tasks assessed/performed Overall Cognitive Status: Within Functional Limits for tasks assessed                                        General Comments      Exercises     Assessment/Plan     PT Assessment All further PT needs can be met in the next venue of care  PT Problem List Decreased mobility;Decreased balance;Decreased strength       PT Treatment Interventions      PT Goals (Current goals can be found in the Care Plan section)  Acute Rehab PT Goals Patient Stated Goal: no more falls, get across hall to [facility] unassiste PT Goal Formulation: All assessment and education complete, DC therapy    Frequency     Barriers to discharge        Co-evaluation               AM-PAC PT "6 Clicks" Mobility  Outcome Measure Help needed turning from your back to your side while in a flat bed without using bedrails?: None Help needed moving from lying on your back to sitting on the side of a flat bed without using bedrails?: None Help needed moving to and from a bed to a chair (including a wheelchair)?: A Little Help needed standing up from a chair using your arms (e.g., wheelchair or bedside chair)?: None Help needed to walk in hospital room?: A Little Help needed climbing 3-5 steps with a railing? : A Little 6 Click Score: 21    End of Session Equipment Utilized During Treatment: Gait belt Activity Tolerance: Patient tolerated treatment well Patient left: in bed;with call bell/phone within reach;with family/visitor present Nurse Communication: Mobility status PT Visit Diagnosis: Unsteadiness on feet (R26.81);Muscle weakness (generalized) (M62.81)    Time: 2025-4270 PT Time Calculation (min) (ACUTE ONLY): 25 min   Charges:   PT Evaluation $PT Eval Low Complexity: 1 Low PT Treatments $Gait Training: 8-22 mins        Kearney Hard, PT, DPT, MS Board Certified Geriatric Clinical Specialist  Herbie Drape 03/24/2018, 12:42 PM

## 2018-03-24 NOTE — Discharge Instructions (Signed)
Syncope Syncope is when you lose temporarily pass out (faint). Signs that you may be about to pass out include:  Feeling dizzy or light-headed.  Feeling sick to your stomach (nauseous).  Seeing all white or all black.  Having cold, clammy skin.  If you passed out, get help right away. Call your local emergency services (911 in the [place] Do not drive yourself to the hospital. Follow these instructions at home: Pay attention to any changes in your symptoms. Take these actions to help with your condition:  Have someone stay with you until you feel stable.  Do not drive, use machinery, or play sports until your doctor says it is okay.  Keep all follow-up visits as told by your doctor. This is important.  If you start to feel like you might pass out, lie down right away and raise (elevate) your feet above the level of your heart. Breathe deeply and steadily. Wait until all of the symptoms are gone.  Drink enough fluid to keep your pee (urine) clear or pale yellow.  If you are taking blood pressure or heart medicine, get up slowly and spend many minutes getting ready to sit and then stand. This can help with dizziness.  Take over-the-counter and prescription medicines only as told by your doctor.  Get help right away if:  You have a very bad headache.  You have unusual pain in your chest, tummy, or back.  You are bleeding from your mouth or rectum.  You have black or tarry poop (stool).  You have a very fast or uneven heartbeat (palpitations).  It hurts to breathe.  You pass out once or more than once.  You have jerky movements that you cannot control (seizure).  You are confused.  You have trouble walking.  You are very weak.  You have vision problems. These symptoms may be an emergency. Do not wait to see if the symptoms will go away. Get medical help right away. Call your local emergency services (911 in the [place] Do not drive yourself to the hospital. This  information is not intended to replace advice given to you by your health care provider. Make sure you discuss any questions you have with your health care provider. Document Released: 09/13/2007 Document Revised: 09/02/2015 Document Reviewed: 12/09/2014 Elsevier Interactive Patient Education  2018 Elsevier Inc.  

## 2018-03-25 DIAGNOSIS — J069 Acute upper respiratory infection, unspecified: Secondary | ICD-10-CM | POA: Diagnosis not present

## 2018-03-25 DIAGNOSIS — Z96652 Presence of left artificial knee joint: Secondary | ICD-10-CM | POA: Diagnosis not present

## 2018-03-25 DIAGNOSIS — E860 Dehydration: Secondary | ICD-10-CM | POA: Diagnosis not present

## 2018-03-25 DIAGNOSIS — R55 Syncope and collapse: Secondary | ICD-10-CM | POA: Diagnosis not present

## 2018-03-25 DIAGNOSIS — M810 Age-related osteoporosis without current pathological fracture: Secondary | ICD-10-CM | POA: Diagnosis not present

## 2018-03-25 DIAGNOSIS — Z96651 Presence of right artificial knee joint: Secondary | ICD-10-CM | POA: Diagnosis not present

## 2018-03-25 DIAGNOSIS — F0390 Unspecified dementia without behavioral disturbance: Secondary | ICD-10-CM | POA: Diagnosis not present

## 2018-03-25 DIAGNOSIS — F329 Major depressive disorder, single episode, unspecified: Secondary | ICD-10-CM | POA: Diagnosis not present

## 2018-03-26 DIAGNOSIS — F0390 Unspecified dementia without behavioral disturbance: Secondary | ICD-10-CM | POA: Diagnosis not present

## 2018-03-26 DIAGNOSIS — F329 Major depressive disorder, single episode, unspecified: Secondary | ICD-10-CM | POA: Diagnosis not present

## 2018-03-26 DIAGNOSIS — J069 Acute upper respiratory infection, unspecified: Secondary | ICD-10-CM | POA: Diagnosis not present

## 2018-03-26 DIAGNOSIS — R55 Syncope and collapse: Secondary | ICD-10-CM | POA: Diagnosis not present

## 2018-03-26 DIAGNOSIS — M810 Age-related osteoporosis without current pathological fracture: Secondary | ICD-10-CM | POA: Diagnosis not present

## 2018-03-26 DIAGNOSIS — E860 Dehydration: Secondary | ICD-10-CM | POA: Diagnosis not present

## 2018-03-28 DIAGNOSIS — F329 Major depressive disorder, single episode, unspecified: Secondary | ICD-10-CM | POA: Diagnosis not present

## 2018-03-28 DIAGNOSIS — J069 Acute upper respiratory infection, unspecified: Secondary | ICD-10-CM | POA: Diagnosis not present

## 2018-03-28 DIAGNOSIS — M810 Age-related osteoporosis without current pathological fracture: Secondary | ICD-10-CM | POA: Diagnosis not present

## 2018-03-28 DIAGNOSIS — R55 Syncope and collapse: Secondary | ICD-10-CM | POA: Diagnosis not present

## 2018-03-28 DIAGNOSIS — F0390 Unspecified dementia without behavioral disturbance: Secondary | ICD-10-CM | POA: Diagnosis not present

## 2018-03-28 DIAGNOSIS — E860 Dehydration: Secondary | ICD-10-CM | POA: Diagnosis not present

## 2018-04-01 DIAGNOSIS — E860 Dehydration: Secondary | ICD-10-CM | POA: Diagnosis not present

## 2018-04-01 DIAGNOSIS — F0390 Unspecified dementia without behavioral disturbance: Secondary | ICD-10-CM | POA: Diagnosis not present

## 2018-04-01 DIAGNOSIS — R55 Syncope and collapse: Secondary | ICD-10-CM | POA: Diagnosis not present

## 2018-04-01 DIAGNOSIS — F329 Major depressive disorder, single episode, unspecified: Secondary | ICD-10-CM | POA: Diagnosis not present

## 2018-04-01 DIAGNOSIS — J069 Acute upper respiratory infection, unspecified: Secondary | ICD-10-CM | POA: Diagnosis not present

## 2018-04-01 DIAGNOSIS — M810 Age-related osteoporosis without current pathological fracture: Secondary | ICD-10-CM | POA: Diagnosis not present

## 2018-04-01 DIAGNOSIS — R5381 Other malaise: Secondary | ICD-10-CM | POA: Diagnosis not present

## 2018-04-02 DIAGNOSIS — R55 Syncope and collapse: Secondary | ICD-10-CM | POA: Diagnosis not present

## 2018-04-02 DIAGNOSIS — F329 Major depressive disorder, single episode, unspecified: Secondary | ICD-10-CM | POA: Diagnosis not present

## 2018-04-02 DIAGNOSIS — M810 Age-related osteoporosis without current pathological fracture: Secondary | ICD-10-CM | POA: Diagnosis not present

## 2018-04-02 DIAGNOSIS — E860 Dehydration: Secondary | ICD-10-CM | POA: Diagnosis not present

## 2018-04-02 DIAGNOSIS — F0390 Unspecified dementia without behavioral disturbance: Secondary | ICD-10-CM | POA: Diagnosis not present

## 2018-04-02 DIAGNOSIS — J069 Acute upper respiratory infection, unspecified: Secondary | ICD-10-CM | POA: Diagnosis not present

## 2018-04-08 DIAGNOSIS — R55 Syncope and collapse: Secondary | ICD-10-CM | POA: Diagnosis not present

## 2018-04-08 DIAGNOSIS — M810 Age-related osteoporosis without current pathological fracture: Secondary | ICD-10-CM | POA: Diagnosis not present

## 2018-04-08 DIAGNOSIS — J069 Acute upper respiratory infection, unspecified: Secondary | ICD-10-CM | POA: Diagnosis not present

## 2018-04-08 DIAGNOSIS — F329 Major depressive disorder, single episode, unspecified: Secondary | ICD-10-CM | POA: Diagnosis not present

## 2018-04-08 DIAGNOSIS — F0390 Unspecified dementia without behavioral disturbance: Secondary | ICD-10-CM | POA: Diagnosis not present

## 2018-04-08 DIAGNOSIS — E860 Dehydration: Secondary | ICD-10-CM | POA: Diagnosis not present

## 2018-04-11 DIAGNOSIS — F0390 Unspecified dementia without behavioral disturbance: Secondary | ICD-10-CM | POA: Diagnosis not present

## 2018-04-11 DIAGNOSIS — M810 Age-related osteoporosis without current pathological fracture: Secondary | ICD-10-CM | POA: Diagnosis not present

## 2018-04-11 DIAGNOSIS — F329 Major depressive disorder, single episode, unspecified: Secondary | ICD-10-CM | POA: Diagnosis not present

## 2018-04-11 DIAGNOSIS — E860 Dehydration: Secondary | ICD-10-CM | POA: Diagnosis not present

## 2018-04-11 DIAGNOSIS — R55 Syncope and collapse: Secondary | ICD-10-CM | POA: Diagnosis not present

## 2018-04-11 DIAGNOSIS — J069 Acute upper respiratory infection, unspecified: Secondary | ICD-10-CM | POA: Diagnosis not present

## 2018-04-12 DIAGNOSIS — F329 Major depressive disorder, single episode, unspecified: Secondary | ICD-10-CM | POA: Diagnosis not present

## 2018-04-12 DIAGNOSIS — R55 Syncope and collapse: Secondary | ICD-10-CM | POA: Diagnosis not present

## 2018-04-12 DIAGNOSIS — E860 Dehydration: Secondary | ICD-10-CM | POA: Diagnosis not present

## 2018-04-12 DIAGNOSIS — J069 Acute upper respiratory infection, unspecified: Secondary | ICD-10-CM | POA: Diagnosis not present

## 2018-04-12 DIAGNOSIS — M810 Age-related osteoporosis without current pathological fracture: Secondary | ICD-10-CM | POA: Diagnosis not present

## 2018-04-12 DIAGNOSIS — F0390 Unspecified dementia without behavioral disturbance: Secondary | ICD-10-CM | POA: Diagnosis not present

## 2018-04-15 DIAGNOSIS — F0390 Unspecified dementia without behavioral disturbance: Secondary | ICD-10-CM | POA: Diagnosis not present

## 2018-04-15 DIAGNOSIS — R55 Syncope and collapse: Secondary | ICD-10-CM | POA: Diagnosis not present

## 2018-04-15 DIAGNOSIS — F329 Major depressive disorder, single episode, unspecified: Secondary | ICD-10-CM | POA: Diagnosis not present

## 2018-04-15 DIAGNOSIS — M810 Age-related osteoporosis without current pathological fracture: Secondary | ICD-10-CM | POA: Diagnosis not present

## 2018-04-15 DIAGNOSIS — E860 Dehydration: Secondary | ICD-10-CM | POA: Diagnosis not present

## 2018-04-15 DIAGNOSIS — J069 Acute upper respiratory infection, unspecified: Secondary | ICD-10-CM | POA: Diagnosis not present

## 2018-05-01 DIAGNOSIS — L84 Corns and callosities: Secondary | ICD-10-CM | POA: Diagnosis not present

## 2018-05-02 DIAGNOSIS — F331 Major depressive disorder, recurrent, moderate: Secondary | ICD-10-CM | POA: Diagnosis not present

## 2018-05-07 DIAGNOSIS — L570 Actinic keratosis: Secondary | ICD-10-CM | POA: Diagnosis not present

## 2018-05-07 DIAGNOSIS — D1800 Hemangioma unspecified site: Secondary | ICD-10-CM | POA: Diagnosis not present

## 2018-05-07 DIAGNOSIS — L821 Other seborrheic keratosis: Secondary | ICD-10-CM | POA: Diagnosis not present

## 2018-07-08 DIAGNOSIS — F331 Major depressive disorder, recurrent, moderate: Secondary | ICD-10-CM | POA: Diagnosis not present

## 2018-07-11 DIAGNOSIS — Z0000 Encounter for general adult medical examination without abnormal findings: Secondary | ICD-10-CM | POA: Diagnosis not present

## 2018-07-11 DIAGNOSIS — R110 Nausea: Secondary | ICD-10-CM | POA: Diagnosis not present

## 2018-07-11 DIAGNOSIS — Z96651 Presence of right artificial knee joint: Secondary | ICD-10-CM | POA: Diagnosis not present

## 2018-07-11 DIAGNOSIS — Z96652 Presence of left artificial knee joint: Secondary | ICD-10-CM | POA: Diagnosis not present

## 2018-07-11 DIAGNOSIS — R413 Other amnesia: Secondary | ICD-10-CM | POA: Diagnosis not present

## 2018-07-11 DIAGNOSIS — Z87898 Personal history of other specified conditions: Secondary | ICD-10-CM | POA: Diagnosis not present

## 2018-07-11 DIAGNOSIS — E559 Vitamin D deficiency, unspecified: Secondary | ICD-10-CM | POA: Diagnosis not present

## 2018-07-11 DIAGNOSIS — F325 Major depressive disorder, single episode, in full remission: Secondary | ICD-10-CM | POA: Diagnosis not present

## 2018-07-11 DIAGNOSIS — Z1389 Encounter for screening for other disorder: Secondary | ICD-10-CM | POA: Diagnosis not present

## 2018-09-12 DIAGNOSIS — F331 Major depressive disorder, recurrent, moderate: Secondary | ICD-10-CM | POA: Diagnosis not present

## 2018-10-07 DIAGNOSIS — L84 Corns and callosities: Secondary | ICD-10-CM | POA: Diagnosis not present

## 2018-12-19 DIAGNOSIS — F331 Major depressive disorder, recurrent, moderate: Secondary | ICD-10-CM | POA: Diagnosis not present

## 2018-12-24 DIAGNOSIS — Z23 Encounter for immunization: Secondary | ICD-10-CM | POA: Diagnosis not present

## 2019-02-10 DIAGNOSIS — L84 Corns and callosities: Secondary | ICD-10-CM | POA: Diagnosis not present

## 2019-04-24 ENCOUNTER — Other Ambulatory Visit: Payer: Medicare Other

## 2019-05-09 ENCOUNTER — Ambulatory Visit: Payer: Medicare Other

## 2019-05-16 ENCOUNTER — Ambulatory Visit: Payer: Medicare Other | Attending: Internal Medicine

## 2019-05-16 DIAGNOSIS — Z23 Encounter for immunization: Secondary | ICD-10-CM | POA: Insufficient documentation

## 2019-05-16 NOTE — Progress Notes (Signed)
   Covid-19 Vaccination Clinic  Name:  Kale Sibole    MRN: [id number] DOB: [birth year]  05/16/2019  Mr. Lamacchia was observed post Covid-19 immunization for 15 minutes without incidence. He was provided with Vaccine Information Sheet and instruction to access the V-Safe system.   Mr. Letter was instructed to call 911 with any severe reactions post vaccine: [place] Kitchen Difficulty breathing  . Swelling of your face and throat  . A fast heartbeat  . A bad rash all over your body  . Dizziness and weakness    Immunizations Administered    Name Date Dose VIS Date Route   Pfizer COVID-19 Vaccine 05/16/2019 10:24 AM 0.3 mL 03/21/2019 Intramuscular   Manufacturer: [place]   Lot: [id number]   [place]: [id number]

## 2019-05-19 DIAGNOSIS — R110 Nausea: Secondary | ICD-10-CM | POA: Diagnosis not present

## 2019-05-19 DIAGNOSIS — F325 Major depressive disorder, single episode, in full remission: Secondary | ICD-10-CM | POA: Diagnosis not present

## 2019-05-19 DIAGNOSIS — R413 Other amnesia: Secondary | ICD-10-CM | POA: Diagnosis not present

## 2019-05-19 DIAGNOSIS — R600 Localized edema: Secondary | ICD-10-CM | POA: Diagnosis not present

## 2019-05-19 DIAGNOSIS — R5383 Other fatigue: Secondary | ICD-10-CM | POA: Diagnosis not present

## 2019-05-19 DIAGNOSIS — E559 Vitamin D deficiency, unspecified: Secondary | ICD-10-CM | POA: Diagnosis not present

## 2019-05-19 DIAGNOSIS — Z87898 Personal history of other specified conditions: Secondary | ICD-10-CM | POA: Diagnosis not present

## 2019-05-28 DIAGNOSIS — L84 Corns and callosities: Secondary | ICD-10-CM | POA: Diagnosis not present

## 2019-06-10 ENCOUNTER — Ambulatory Visit: Payer: Medicare Other | Attending: Internal Medicine

## 2019-06-10 DIAGNOSIS — Z23 Encounter for immunization: Secondary | ICD-10-CM | POA: Insufficient documentation

## 2019-06-10 NOTE — Progress Notes (Signed)
   Covid-19 Vaccination Clinic  Name:  Steven Mullen    MRN: [id number] DOB: February 27, 1929  06/10/2019  Steven Mullen was observed post Covid-19 immunization for 15 minutes without incident. He was provided with Vaccine Information Sheet and instruction to access the V-Safe system.   Steven Mullen was instructed to call 911 with any severe reactions post vaccine: [place] Kitchen Difficulty breathing  . Swelling of face and throat  . A fast heartbeat  . A bad rash all over body  . Dizziness and weakness   Immunizations Administered    Name Date Dose VIS Date Route   Pfizer COVID-19 Vaccine 06/10/2019 11:01 AM 0.3 mL 03/21/2019 Intramuscular   Manufacturer: [place]   Lot: [id number]   [place]: [id number]

## 2019-06-12 DIAGNOSIS — R5383 Other fatigue: Secondary | ICD-10-CM | POA: Diagnosis not present

## 2019-06-12 DIAGNOSIS — E559 Vitamin D deficiency, unspecified: Secondary | ICD-10-CM | POA: Diagnosis not present

## 2019-06-13 ENCOUNTER — Telehealth: Payer: Self-pay | Admitting: Cardiology

## 2019-06-13 NOTE — Telephone Encounter (Signed)
   Pt's daughter called requesting to come in with the Pt to his appt on 06/16/19. She said the pt is on a wheelchair and she will be helping him  Please advise

## 2019-06-13 NOTE — Telephone Encounter (Signed)
Spoke with patient's daughter. Daughter reports patient needs physical assistance and has some dementia. Okayed daughter to accompany patient to appointment on Monday.

## 2019-06-15 NOTE — Progress Notes (Signed)
Cardiology Office Note:    Date:  06/16/2019   ID:  Steven Mullen, DOB [birth year], MRN [id number]  PCP:  Steven Contras, MD  Cardiologist:  No primary care provider on file.  Electrophysiologist:  None   Referring MD: Steven Contras, MD   Chief Complaint  Patient presents with  . Edema    History of Present Illness:    Steven Mullen is a 86 y.o. male with a hx of BPH, hyperlipidemia, dementia who is referred by Dr. Moreen Mullen for evaluation of lower extremity edema.  Per his daughter, reports about 1 month ago started having lower extremity edema.  Also noted weight gain.  He weighed 159 pounds when moved in with his daughter a year ago, was up to 167 pounds recently.  Also been having nausea, which resolved with Zofran.  Most exertion he does is using his walker around their home.  Patient was doing stationary bicycle a few times per week but has not been doing that much since December.  His daughter had noted that he has been fatigued and sleeping a lot throughout the day.  He denies any exertional chest pain or dyspnea.  Denies any lightheadedness or syncope.  Never smoked.  Mother had MI in 76s, Father had MI in 63s.  He had an admission to [facility] from 03/22/2018 through 03/25/2018 with syncope.  TTE showed normal LV systolic function.  Etiology of syncope was thought to be dehydration, vasovagal.    Past Medical History:  Diagnosis Date  . BPH (benign prostatic hyperplasia)   . History of knee replacement   . Hyperlipidemia   . Osteoporosis   . Quadriceps weakness     Past Surgical History:  Procedure Laterality Date  . REPLACEMENT TOTAL KNEE BILATERAL      Current Medications: Current Meds  Medication Sig  . ergocalciferol (VITAMIN D2) 1.25 MG (50000 UT) capsule TAKE ONE CAPSULE BY MOUTH EVERY WEEK  . Multiple Vitamin (MULTIVITAMIN) tablet Take by mouth.  . ondansetron (ZOFRAN) 4 MG tablet Take 4 mg by mouth every 8 (eight) hours as needed for nausea or vomiting.      Allergies:   Patient has no known allergies.   Social History   Socioeconomic History  . Marital status: Widowed    Spouse name: Not on file  . Number of children: Not on file  . Years of education: Not on file  . Highest education level: Not on file  Occupational History  . Not on file  Tobacco Use  . Smoking status: Never Smoker  . Smokeless tobacco: Never Used  Substance and Sexual Activity  . Alcohol use: Not Currently  . Drug use: Not on file  . Sexual activity: Not on file  Other Topics Concern  . Not on file  Social History Narrative  . Not on file   Social Determinants of Health   Financial Resource Strain:   . Difficulty of Paying Living Expenses: Not on file  Food Insecurity:   . Worried About Charity fundraiser in the Last Year: Not on file  . Ran Out of Food in the Last Year: Not on file  Transportation Needs:   . Lack of Transportation (Medical): Not on file  . Lack of Transportation (Non-Medical): Not on file  Physical Activity:   . Days of Exercise per Week: Not on file  . Minutes of Exercise per Session: Not on file  Stress:   . Feeling of Stress : Not on file  Social Connections:   .  Frequency of Communication with Friends and Family: Not on file  . Frequency of Social Gatherings with Friends and Family: Not on file  . Attends Religious Services: Not on file  . Active Member of Clubs or Organizations: Not on file  . Attends Archivist Meetings: Not on file  . Marital Status: Not on file     Family History: Mother had MI in 74s, Father had MI in 41s.  ROS:   Please see the history of present illness.     All other systems reviewed and are negative.  EKGs/Labs/Other Studies Reviewed:    The following studies were reviewed today:   EKG:  EKG is  ordered today.  The ekg ordered today demonstrates normal sinus rhythm, rate 74, first-degree AV block, right bundle branch block, left anterior fascicular block  TTE 03/23/18: - Left  ventricle: The cavity size was normal. Wall thickness was  increased in a pattern of mild LVH. Systolic function was normal.  The estimated ejection fraction was in the range of 60% to 65%.  Wall motion was normal; there were no regional wall motion  abnormalities. Left ventricular diastolic function parameters  were normal.  - Aortic valve: There was trivial regurgitation.  - Aortic root: The aortic root was mildly dilated.  - Ascending aorta: The ascending aorta was mildly dilated.  - Mitral valve: Valve area by pressure half-time: 2.24 cm^2.  - Right ventricle: The cavity size was mildly dilated.  - Atrial septum: No defect or patent foramen ovale was identified.  - Tricuspid valve: There was mild regurgitation.  - Pulmonic valve: There was mild regurgitation.  - Pulmonary arteries: PA peak pressure: 37 mm Hg (S).  - Pericardium, extracardiac: A trivial pericardial effusion was  identified posterior to the heart.   Recent Labs: No results found for requested labs within last 8760 hours.  Recent Lipid Panel No results found for: CHOL, TRIG, HDL, CHOLHDL, VLDL, LDLCALC, LDLDIRECT  Physical Exam:    VS:  BP 120/80   Pulse 74   Temp (!) 97 F (36.1 C)   Ht 5\' 10"  (1.778 m)   Wt 167 lb 9.6 oz (76 kg)   SpO2 93%   BMI 24.05 kg/m     Wt Readings from Last 3 Encounters:  06/16/19 167 lb 9.6 oz (76 kg)  03/23/18 164 lb 0.4 oz (74.4 kg)     GEN:  in no acute distress HEENT: Normal NECK: No JVD CARDIAC: irregular, normal rate, 2/6 systolic murmur RESPIRATORY:  Clear to auscultation without rales, wheezing or rhonchi  ABDOMEN: Soft, non-tender, non-distended MUSCULOSKELETAL:  trace edema SKIN: Warm and dry NEUROLOGIC:  Alert and oriented x 3 PSYCHIATRIC:  Normal affect   ASSESSMENT:    1. Shortness of breath   2. Leg edema   3. Weight gain    PLAN:    Lower extremity edema/shortness of breath/fatigue/weight gain: Unclear etiology.  He appears euvolemic on  exam today.  Will check TTE to evaluate for structural heart disease  Virtual visit in 1 month   Medication Adjustments/Labs and Tests Ordered: Current medicines are reviewed at length with the patient today.  Concerns regarding medicines are outlined above.  Orders Placed This Encounter  Procedures  . EKG 12-Lead  . ECHOCARDIOGRAM COMPLETE   No orders of the defined types were placed in this encounter.   Patient Instructions  Medication Instructions:  Your physician recommends that you continue on your current medications as directed. Please refer to the Current Medication  list given to you today.  Lab Work: NONE  Testing/Procedures: Your physician has requested that you have an echocardiogram. Echocardiography is a painless test that uses sound waves to create images of your heart. It provides your doctor with information about the size and shape of your heart and how well your heart's chambers and valves are working. This procedure takes approximately one hour. There are no restrictions for this procedure.  This will be done at our [facility] location:  [place]: At [employer], you and your health needs are our priority.  As part of our continuing mission to provide you with exceptional heart care, we have created designated Provider Care Teams.  These Care Teams include your primary Cardiologist (physician) and Advanced Practice Providers (APPs -  Physician Assistants and Nurse Practitioners) who all work together to provide you with the care you need, when you need it.  We recommend signing up for the patient portal called "MyChart".  Sign up information is provided on this After Visit Summary.  MyChart is used to connect with patients for Virtual Visits (Telemedicine).  Patients are able to view lab/test results, encounter notes, upcoming appointments, etc.  Non-urgent messages can be sent to your provider as well.   To learn more about  what you can do with MyChart, go to [email or web address].    Your next appointment:   1 month(s)  The format for your next appointment:   Virtual Visit   Provider:   Oswaldo Milian, MD         Signed, Donato Heinz, MD  06/16/2019 6:44 PM    [place]

## 2019-06-16 ENCOUNTER — Other Ambulatory Visit: Payer: Self-pay

## 2019-06-16 ENCOUNTER — Ambulatory Visit (INDEPENDENT_AMBULATORY_CARE_PROVIDER_SITE_OTHER): Payer: Medicare Other | Admitting: Cardiology

## 2019-06-16 ENCOUNTER — Encounter: Payer: Self-pay | Admitting: Cardiology

## 2019-06-16 VITALS — BP 120/80 | HR 74 | Temp 97.0°F | Ht 70.0 in | Wt 167.6 lb

## 2019-06-16 DIAGNOSIS — R0602 Shortness of breath: Secondary | ICD-10-CM

## 2019-06-16 DIAGNOSIS — R635 Abnormal weight gain: Secondary | ICD-10-CM

## 2019-06-16 DIAGNOSIS — R600 Localized edema: Secondary | ICD-10-CM

## 2019-06-16 NOTE — Patient Instructions (Signed)
Medication Instructions:  Your physician recommends that you continue on your current medications as directed. Please refer to the Current Medication list given to you today.  Lab Work: NONE  Testing/Procedures: Your physician has requested that you have an echocardiogram. Echocardiography is a painless test that uses sound waves to create images of your heart. It provides your doctor with information about the size and shape of your heart and how well your heart's chambers and valves are working. This procedure takes approximately one hour. There are no restrictions for this procedure.  This will be done at our [facility] location:  [place]: At [employer], you and your health needs are our priority.  As part of our continuing mission to provide you with exceptional heart care, we have created designated Provider Care Teams.  These Care Teams include your primary Cardiologist (physician) and Advanced Practice Providers (APPs -  Physician Assistants and Nurse Practitioners) who all work together to provide you with the care you need, when you need it.  We recommend signing up for the patient portal called "MyChart".  Sign up information is provided on this After Visit Summary.  MyChart is used to connect with patients for Virtual Visits (Telemedicine).  Patients are able to view lab/test results, encounter notes, upcoming appointments, etc.  Non-urgent messages can be sent to your provider as well.   To learn more about what you can do with MyChart, go to [email or web address].    Your next appointment:   1 month(s)  The format for your next appointment:   Virtual Visit   Provider:   Oswaldo Milian, MD

## 2019-07-01 ENCOUNTER — Ambulatory Visit (HOSPITAL_COMMUNITY): Payer: Medicare Other | Attending: Cardiology

## 2019-07-01 ENCOUNTER — Other Ambulatory Visit: Payer: Self-pay

## 2019-07-01 DIAGNOSIS — R0602 Shortness of breath: Secondary | ICD-10-CM | POA: Insufficient documentation

## 2019-07-16 NOTE — Progress Notes (Signed)
Virtual Visit via Telephone Note   This visit type was conducted due to national recommendations for restrictions regarding the COVID-19 Pandemic (e.g. social distancing) in an effort to limit this patient's exposure and mitigate transmission in our community.  Due to his co-morbid illnesses, this patient is at least at moderate risk for complications without adequate follow up.  This format is felt to be most appropriate for this patient at this time.  The patient did not have access to video technology/had technical difficulties with video requiring transitioning to audio format only (telephone).  All issues noted in this document were discussed and addressed.  No physical exam could be performed with this format.  Please refer to the patient's chart for his  consent to telehealth for [facility].   Date:  07/17/2019   ID:  Steven Mullen, DOB [birth year], MRN [id number]  Patient Location: Home Provider Location: Office  PCP:  Antony Contras, MD  Cardiologist:  No primary care provider on file.  Electrophysiologist:  None   Evaluation Performed:  Follow-Up Visit  Chief Complaint:  LE edema  History of Present Illness:    Steven Mullen is a 86 y.o. male with a hx of BPH, hyperlipidemia, dementia who presents for follow-up. He was referred by Dr. Moreen Fowler for evaluation of lower extremity edema, initially seen on 06/16/2019.  Per his daughter, reports about 1 month ago started having lower extremity edema.  Also noted weight gain.  He weighed 159 pounds when moved in with his daughter a year ago, was up to 167 pounds recently.  Also been having nausea, which resolved with Zofran.  Most exertion he does is using his walker around their home.  Patient was doing stationary bicycle a few times per week but has not been doing that much since December.  His daughter had noted that he has been fatigued and sleeping a lot throughout the day.  He denies any exertional chest pain or dyspnea.  Denies any  lightheadedness or syncope.  Never smoked.  Mother had MI in 64s, Father had MI in 41s.  He had an admission to [facility] from 03/22/2018 through 03/25/2018 with syncope.  TTE showed normal LV systolic function.  Etiology of syncope was thought to be dehydration, vasovagal.  TTE was done on 07/01/2019, which showed EF 55 to 123456, normal diastolic function, moderately enlarged RV, mild to moderate pulmonic regurgitation, mild aortic regurgitation, mild ascending aorta dilatation measuring 39 mm.  Since last clinic visit, he reports he has been doing well.  Reports weight is down.  Denies any dyspnea, chest pain, lightheadedness, palpitations, or syncope.  Does report feeling fatigued, his daughter reports sometimes he will sleep for 15 hours in a day.  Reports had episode 3 days ago where he had nausea but other than that has no complaints.   Past Medical History:  Diagnosis Date  . BPH (benign prostatic hyperplasia)   . History of knee replacement   . Hyperlipidemia   . Osteoporosis   . Quadriceps weakness    Past Surgical History:  Procedure Laterality Date  . REPLACEMENT TOTAL KNEE BILATERAL       Current Meds  Medication Sig  . buPROPion (WELLBUTRIN XL) 150 MG 24 hr tablet Take 150 mg by mouth every morning.  . donepezil (ARICEPT) 5 MG tablet Take 5 mg by mouth daily.  . Multiple Vitamin (MULTIVITAMIN) tablet Take by mouth.  . ondansetron (ZOFRAN) 4 MG tablet Take 4 mg by mouth every 8 (eight) hours as needed  for nausea or vomiting.     Allergies:   Patient has no known allergies.   Social History   Tobacco Use  . Smoking status: Never Smoker  . Smokeless tobacco: Never Used  Substance Use Topics  . Alcohol use: Not Currently  . Drug use: Not on file     Family Hx: The patient's family history is not on file.  ROS:   Please see the history of present illness.     All other systems reviewed and are negative.   Prior CV studies:   The following studies were reviewed  today:  TTE 07/01/19: 1. Left ventricular ejection fraction, by estimation, is 55 to 60%. The  left ventricle has normal function. The left ventricle has no regional  wall motion abnormalities. There is mild left ventricular hypertrophy.  Left ventricular diastolic parameters  were normal.  2. Right ventricle is not well-visualized. Right ventricular systolic  function is grossly normal. The right ventricular size is moderately  enlarged. There is normal pulmonary artery systolic pressure.  3. Pulmonic valve regurgitation is mild to moderate.  4. The mitral valve is normal in structure. No evidence of mitral valve  regurgitation.  5. The aortic valve is tricuspid. Aortic valve regurgitation is mild. No  aortic stenosis is present.  6. Aortic dilatation noted. There is mild dilatation of the ascending  aorta measuring 39 mm.  7. The inferior vena cava is normal in size with greater than 50%  respiratory variability, suggesting right atrial pressure of 3 mmHg.  Labs/Other Tests and Data Reviewed:    EKG:  No ECG reviewed.  Recent Labs: No results found for requested labs within last 8760 hours.   Recent Lipid Panel No results found for: CHOL, TRIG, HDL, CHOLHDL, LDLCALC, LDLDIRECT  Wt Readings from Last 3 Encounters:  07/17/19 162 lb 14.4 oz (73.9 kg)  06/16/19 167 lb 9.6 oz (76 kg)  03/23/18 164 lb 0.4 oz (74.4 kg)     Objective:    Vital Signs:  BP 119/72   Ht 5\' 10"  (1.778 m)   Wt 162 lb 14.4 oz (73.9 kg)   BMI 23.37 kg/m    VITAL SIGNS:  reviewed  Gen:no respiratory distress, able to converse comfortably  ASSESSMENT & PLAN:    Lower extremity edema/shortness of breath/fatigue/weight gain: Unclear etiology.  He appeared euvolemic in clinic on 3/8.  TTE was done on 07/01/2019, which showed EF 55 to 123456, normal diastolic function, moderately enlarged RV, mild to moderate pulmonic regurgitation, mild aortic regurgitation, mild ascending aorta dilatation measuring  39 mm. -No further cardiac work-up recommended at this time.  We will plan to repeat TTE in 1 year to follow aortic/pulmonic regurgitation, RV chamber size, and aortic dilatation   Time:   Today, I have spent 8 minutes with the patient with telehealth technology discussing the above problems.     Medication Adjustments/Labs and Tests Ordered: Current medicines are reviewed at length with the patient today.  Concerns regarding medicines are outlined above.   Tests Ordered: Orders Placed This Encounter  Procedures  . ECHOCARDIOGRAM COMPLETE    Medication Changes: No orders of the defined types were placed in this encounter.   Follow Up:  In Person in 1 year(s)  Signed, Donato Heinz, MD  07/17/2019 6:00 PM    [facility]

## 2019-07-17 ENCOUNTER — Telehealth (INDEPENDENT_AMBULATORY_CARE_PROVIDER_SITE_OTHER): Payer: Medicare Other | Admitting: Cardiology

## 2019-07-17 ENCOUNTER — Encounter: Payer: Self-pay | Admitting: Cardiology

## 2019-07-17 VITALS — BP 119/72 | Ht 70.0 in | Wt 162.9 lb

## 2019-07-17 DIAGNOSIS — R635 Abnormal weight gain: Secondary | ICD-10-CM | POA: Diagnosis not present

## 2019-07-17 DIAGNOSIS — I351 Nonrheumatic aortic (valve) insufficiency: Secondary | ICD-10-CM | POA: Diagnosis not present

## 2019-07-17 DIAGNOSIS — I371 Nonrheumatic pulmonary valve insufficiency: Secondary | ICD-10-CM

## 2019-07-17 DIAGNOSIS — R600 Localized edema: Secondary | ICD-10-CM

## 2019-07-17 DIAGNOSIS — R0602 Shortness of breath: Secondary | ICD-10-CM | POA: Diagnosis not present

## 2019-07-17 NOTE — Patient Instructions (Signed)
Medication Instructions:  Your physician recommends that you continue on your current medications as directed. Please refer to the Current Medication list given to you today.  *If you need a refill on your cardiac medications before your next appointment, please call your pharmacy*   Lab Work: NONE  Testing/Procedures: Your physician has requested that you have an echocardiogram in [place] (April 2022). Echocardiography is a painless test that uses sound waves to create images of your heart. It provides your doctor with information about the size and shape of your heart and how well your heart's chambers and valves are working. This procedure takes approximately one hour. There are no restrictions for this procedure.  Follow-Up: At [facility], you and your health needs are our priority.  As part of our continuing mission to provide you with exceptional heart care, we have created designated Provider Care Teams.  These Care Teams include your primary Cardiologist (physician) and Advanced Practice Providers (APPs -  Physician Assistants and Nurse Practitioners) who all work together to provide you with the care you need, when you need it.  We recommend signing up for the patient portal called "MyChart".  Sign up information is provided on this After Visit Summary.  MyChart is used to connect with patients for Virtual Visits (Telemedicine).  Patients are able to view lab/test results, encounter notes, upcoming appointments, etc.  Non-urgent messages can be sent to your provider as well.   To learn more about what you can do with MyChart, go to [email or web address].    Your next appointment:   12 month(s)  (after echo)  The format for your next appointment:   In Person  Provider:   Oswaldo Milian, MD

## 2019-08-05 DIAGNOSIS — E559 Vitamin D deficiency, unspecified: Secondary | ICD-10-CM | POA: Diagnosis not present

## 2019-08-05 DIAGNOSIS — I77819 Aortic ectasia, unspecified site: Secondary | ICD-10-CM | POA: Diagnosis not present

## 2019-08-05 DIAGNOSIS — G3184 Mild cognitive impairment, so stated: Secondary | ICD-10-CM | POA: Diagnosis not present

## 2019-08-05 DIAGNOSIS — F325 Major depressive disorder, single episode, in full remission: Secondary | ICD-10-CM | POA: Diagnosis not present

## 2019-08-05 DIAGNOSIS — M545 Low back pain: Secondary | ICD-10-CM | POA: Diagnosis not present

## 2019-08-05 DIAGNOSIS — R5381 Other malaise: Secondary | ICD-10-CM | POA: Diagnosis not present

## 2019-08-05 DIAGNOSIS — R5383 Other fatigue: Secondary | ICD-10-CM | POA: Diagnosis not present

## 2019-08-05 DIAGNOSIS — Z87898 Personal history of other specified conditions: Secondary | ICD-10-CM | POA: Diagnosis not present

## 2019-08-12 DIAGNOSIS — I77819 Aortic ectasia, unspecified site: Secondary | ICD-10-CM | POA: Diagnosis not present

## 2019-08-12 DIAGNOSIS — I371 Nonrheumatic pulmonary valve insufficiency: Secondary | ICD-10-CM | POA: Diagnosis not present

## 2019-08-12 DIAGNOSIS — E559 Vitamin D deficiency, unspecified: Secondary | ICD-10-CM | POA: Diagnosis not present

## 2019-08-12 DIAGNOSIS — N400 Enlarged prostate without lower urinary tract symptoms: Secondary | ICD-10-CM | POA: Diagnosis not present

## 2019-08-12 DIAGNOSIS — F325 Major depressive disorder, single episode, in full remission: Secondary | ICD-10-CM | POA: Diagnosis not present

## 2019-08-12 DIAGNOSIS — F0390 Unspecified dementia without behavioral disturbance: Secondary | ICD-10-CM | POA: Diagnosis not present

## 2019-08-12 DIAGNOSIS — E785 Hyperlipidemia, unspecified: Secondary | ICD-10-CM | POA: Diagnosis not present

## 2019-08-12 DIAGNOSIS — M545 Low back pain: Secondary | ICD-10-CM | POA: Diagnosis not present

## 2019-08-12 DIAGNOSIS — I351 Nonrheumatic aortic (valve) insufficiency: Secondary | ICD-10-CM | POA: Diagnosis not present

## 2019-08-12 DIAGNOSIS — R001 Bradycardia, unspecified: Secondary | ICD-10-CM | POA: Diagnosis not present

## 2019-08-12 DIAGNOSIS — M810 Age-related osteoporosis without current pathological fracture: Secondary | ICD-10-CM | POA: Diagnosis not present

## 2019-08-14 ENCOUNTER — Telehealth: Payer: Self-pay | Admitting: Cardiology

## 2019-08-14 DIAGNOSIS — F0390 Unspecified dementia without behavioral disturbance: Secondary | ICD-10-CM | POA: Diagnosis not present

## 2019-08-14 DIAGNOSIS — I351 Nonrheumatic aortic (valve) insufficiency: Secondary | ICD-10-CM | POA: Diagnosis not present

## 2019-08-14 DIAGNOSIS — M545 Low back pain: Secondary | ICD-10-CM | POA: Diagnosis not present

## 2019-08-14 DIAGNOSIS — I371 Nonrheumatic pulmonary valve insufficiency: Secondary | ICD-10-CM | POA: Diagnosis not present

## 2019-08-14 DIAGNOSIS — R001 Bradycardia, unspecified: Secondary | ICD-10-CM | POA: Diagnosis not present

## 2019-08-14 DIAGNOSIS — I77819 Aortic ectasia, unspecified site: Secondary | ICD-10-CM | POA: Diagnosis not present

## 2019-08-14 NOTE — Telephone Encounter (Signed)
STAT if HR is under 50 or over 120 (normal HR is 60-100 beats per minute)  1) What is your heart rate? 40's   2) Do you have a log of your heart rate readings (document readings)? 40's between 12:30pm-1:30pm, highest was 64  3) Do you have any other symptoms? fatigue   Patient's daughter is not currently with the patient. She states she is at a doctor's appointment and her god daughter is with the patient. She states Home Health came to see the patient today and told them the patient's HR was in the 40's. She states they were advised to go to Urgent Care, but she does not feel it is an emergency so they did not go. She states the patient is currently taking a nap and she does not know his HR now. She states she will be home with the patient in about half an hour. She would like to know if the patient can be squeezed in to see Dr. Gardiner Rhyme.

## 2019-08-14 NOTE — Telephone Encounter (Signed)
Spoke with patient's daughter Steven Mullen. She reports home health PT was at the home today and patient's HR was in the low 40s for the hour in which PT occurred. Heart rate did go up to about 64 during exercise but stayed low. Physical therapist contacted patient's PCP who advised patient go to the ER or urgent care for evaluation. Patient and family refused. Patient is tired but no other symptoms.   Unable to check HR at this time, not sure what heart rate currently is. Daughter reports patient has been wanting to nap more frequently and has been tired overall but states if his HR has been low like this the whole time she isn't surprised. Daughter does not want to go to ER with patient and put him through that at this time.  Advised if patient develops symptoms or patient starts to feel worse to report to the ER. Patient placed on APP schedule for the morning. Patient takes no medications for HR or BP, no meds to be held. Daughter reports family friend who is an MD will come by later and check on patient.

## 2019-08-15 ENCOUNTER — Telehealth: Payer: Self-pay | Admitting: Internal Medicine

## 2019-08-15 ENCOUNTER — Ambulatory Visit (INDEPENDENT_AMBULATORY_CARE_PROVIDER_SITE_OTHER): Payer: Medicare Other | Admitting: Cardiology

## 2019-08-15 ENCOUNTER — Encounter: Payer: Self-pay | Admitting: Cardiology

## 2019-08-15 ENCOUNTER — Other Ambulatory Visit: Payer: Self-pay

## 2019-08-15 ENCOUNTER — Other Ambulatory Visit (HOSPITAL_COMMUNITY)
Admission: RE | Admit: 2019-08-15 | Discharge: 2019-08-15 | Disposition: A | Discharge status: Home / Self Care | Payer: Medicare Other | Source: Ambulatory Visit | Attending: Internal Medicine | Admitting: Internal Medicine

## 2019-08-15 DIAGNOSIS — Z01812 Encounter for preprocedural laboratory examination: Secondary | ICD-10-CM | POA: Diagnosis not present

## 2019-08-15 DIAGNOSIS — Z87898 Personal history of other specified conditions: Secondary | ICD-10-CM

## 2019-08-15 DIAGNOSIS — I441 Atrioventricular block, second degree: Secondary | ICD-10-CM | POA: Diagnosis not present

## 2019-08-15 DIAGNOSIS — I452 Bifascicular block: Secondary | ICD-10-CM | POA: Diagnosis not present

## 2019-08-15 DIAGNOSIS — R609 Edema, unspecified: Secondary | ICD-10-CM

## 2019-08-15 DIAGNOSIS — F0390 Unspecified dementia without behavioral disturbance: Secondary | ICD-10-CM

## 2019-08-15 DIAGNOSIS — R001 Bradycardia, unspecified: Secondary | ICD-10-CM | POA: Diagnosis not present

## 2019-08-15 DIAGNOSIS — Z20822 Contact with and (suspected) exposure to covid-19: Secondary | ICD-10-CM | POA: Insufficient documentation

## 2019-08-15 DIAGNOSIS — F03B0 Unspecified dementia, moderate, without behavioral disturbance, psychotic disturbance, mood disturbance, and anxiety: Secondary | ICD-10-CM | POA: Insufficient documentation

## 2019-08-15 DIAGNOSIS — F03A0 Unspecified dementia, mild, without behavioral disturbance, psychotic disturbance, mood disturbance, and anxiety: Secondary | ICD-10-CM | POA: Insufficient documentation

## 2019-08-15 NOTE — Patient Instructions (Addendum)
Take it easy over the weekend-if you experience a syncopal episode or near-syncopal episode-go to [facility].     Medications: Your physician recommends that you continue on your current medications as directed. Please refer to the Current Medication list given to you today.     * If you need a refill on your cardiac medications before your next appointment, please call your pharmacy. *  Labwork: Pre procedure lab work today: BMET & CBC w/ diff  You will need to have the coronavirus test completed prior to your procedure. An appointment has been made TODAY at 11:45 AM. This is a Drive Up Visit at the [facility] [address]. Please tell that you are there for pre procedure testing. Someone will direct you to the appropriate testing line. Stay in your car and someone will be with you shortly. Please make sure to have all other labs completed before this test because you will need to stay quarantined until your procedure.  * Will notify you of abnormal results, otherwise continue current treatment plan.*  Testing/Procedures: Your physician has recommended that you have a pacemaker inserted. A pacemaker is a small device that is placed under the skin of your chest or abdomen to help control abnormal heart rhythms. This device uses electrical pulses to prompt the heart to beat at a normal rate. Pacemakers are used to treat heart rhythms that are too slow. Wire (leads) are attached to the pacemaker that goes into the chambers of you heart. This is done in the [facility] and usually requires and overnight stay. Please follow the instructions below, located under the special instructions section.  Follow-Up: Your physician recommends that you schedule a wound check appointment 10-14 days, after your procedure on 08/18/19, with the device clinic.   Your physician recommends that you schedule a follow up appointment in 91 days, after your procedure on 08/18/19, with Dr.  Lovena [facility].  Thank you for choosing [facility]!!      Any Other Special Instructions Will Be Listed Below (If Applicable).     Implantable Device Instructions  You are scheduled for:                  _____ Permanent Transvenous Pacemaker  on  08/18/19  with Dr. Lovena [facility].  1.   Please arrive at the [facility], Entrance "A"  at [facility] at  11 AM on the day of your procedure. (The address is [address])  2. Do not eat or drink after midnight the night before your procedure.  4.  All of remaining medications may be taken with a small amount of water the morning of your procedure.  5.  Plan for an overnight stay.  Bring your insurance cards and a list of you medications.  6.  Wash your chest and neck with surgical scrub the evening befoe and the morning of your procedure.  Rinse well. Please review the surgical scrub instruction sheet given to you. You may pick this up at the [facility] office or any drug store.  7. Your chest will need to be shaved prior to this procedure (if needed). We ask that you do this yourself at home 1 to 2 days before or if uncomfortable/unable to do yourself, then it will be performed by the [facility] staff the day of.                                                                                                                *  If you have ant questions after you get home, please call Steven Haw, RN at [phone]  * Every attempt is made to prevent procedures from being rescheduled.  Due to the nature of  Electrophysiology, rescheduling can happen.  The physician is always aware and directs the staff when this occurs.     Pacemaker Implantation, Adult Pacemaker implantation is a procedure to place a pacemaker inside your chest. A pacemaker is a small computer that sends electrical signals to the heart and helps your heart beat normally. A pacemaker also stores information about your heart rhythms. You may need pacemaker implantation  if you:  Have a slow heartbeat (bradycardia).  Faint (syncope).  Have shortness of breath (dyspnea) due to heart problems.  The pacemaker attaches to your heart through a wire, called a lead. Sometimes just one lead is needed. Other times, there will be two leads. There are two types of pacemakers:  Transvenous pacemaker. This type is placed under the skin or muscle of your chest. The lead goes through a vein in the chest area to reach the inside of the heart.  Epicardial pacemaker. This type is placed under the skin or muscle of your chest or belly. The lead goes through your chest to the outside of the heart.  Tell a health care provider about:  Any allergies you have.  All medicines you are taking, including vitamins, herbs, eye drops, creams, and over-the-counter medicines.  Any problems you or family members have had with anesthetic medicines.  Any blood or bone disorders you have.  Any surgeries you have had.  Any medical conditions you have.  Whether you are pregnant or may be pregnant. What are the risks? Generally, this is a safe procedure. However, problems may occur, including:  Infection.  Bleeding.  Failure of the pacemaker or the lead.  Collapse of a lung or bleeding into a lung.  Blood clot inside a blood vessel with a lead.  Damage to the heart.  Infection inside the heart (endocarditis).  Allergic reactions to medicines.  What happens before the procedure? Staying hydrated Follow instructions from your health care provider about hydration, which may include:  Up to 2 hours before the procedure - you may continue to drink clear liquids, such as water, clear fruit juice, black coffee, and plain tea.  Eating and drinking restrictions Follow instructions from your health care provider about eating and drinking, which may include:  8 hours before the procedure - stop eating heavy meals or foods such as meat, fried foods, or fatty foods.  6 hours  before the procedure - stop eating light meals or foods, such as toast or cereal.  6 hours before the procedure - stop drinking milk or drinks that contain milk.  2 hours before the procedure - stop drinking clear liquids.  Medicines  Ask your health care provider about: ? Changing or stopping your regular medicines. This is especially important if you are taking diabetes medicines or blood thinners. ? Taking medicines such as aspirin and ibuprofen. These medicines can thin your blood. Do not take these medicines before your procedure if your health care provider instructs you not to.  You may be given antibiotic medicine to help prevent infection. General instructions  You will have a heart evaluation. This may include an electrocardiogram (ECG), chest X-ray, and heart imaging (echocardiogram,  or echo) tests.  You will have blood tests.  Do not use any products that contain nicotine or tobacco, such as cigarettes and e-cigarettes. If  you need help quitting, ask your health care provider.  Plan to have someone take you home from the [facility] or clinic.  If you will be going home right after the procedure, plan to have someone with you for 24 hours.  Ask your health care provider how your surgical site will be marked or identified. What happens during the procedure?  To reduce your risk of infection: ? Your health care team will wash or sanitize their hands. ? Your skin will be washed with soap. ? Hair may be removed from the surgical area.  An IV tube will be inserted into one of your veins.  You will be given one or more of the following: ? A medicine to help you relax (sedative). ? A medicine to numb the area (local anesthetic). ? A medicine to make you fall asleep (general anesthetic).  If you are getting a transvenous pacemaker: ? An incision will be made in your upper chest. ? A pocket will be made for the pacemaker. It may be placed under the skin or between layers of  muscle. ? The lead will be inserted into a blood vessel that returns to the heart. ? While X-rays are taken by an imaging machine (fluoroscopy), the lead will be advanced through the vein to the inside of your heart. ? The other end of the lead will be tunneled under the skin and attached to the pacemaker.  If you are getting an epicardial pacemaker: ? An incision will be made near your ribs or breastbone (sternum) for the lead. ? The lead will be attached to the outside of your heart. ? Another incision will be made in your chest or upper belly to create a pocket for the pacemaker. ? The free end of the lead will be tunneled under the skin and attached to the pacemaker.  The transvenous or epicardial pacemaker will be tested. Imaging studies may be done to check the lead position.  The incisions will be closed with stitches (sutures), adhesive strips, or skin glue.  Bandages (dressing) will be placed over the incisions. The procedure may vary among health care providers and hospitals. What happens after the procedure?  Your blood pressure, heart rate, breathing rate, and blood oxygen level will be monitored until the medicines you were given have worn off.  You will be given antibiotics and pain medicine.  ECG and chest x-rays will be done.  You will wear a continuous type of ECG (Holter monitor) to check your heart rhythm.  Your health care provider will program the pacemaker.  Do not drive for 24 hours if you received a sedative. This information is not intended to replace advice given to you by your health care provider. Make sure you discuss any questions you have with your health care provider. Document Released: 03/17/2002 Document Revised: 10/15/2015 Document Reviewed: 09/08/2015 Elsevier Interactive Patient Education  2018 [employer].     Pacemaker Implantation, Adult, Care After This sheet gives you information about how to care for yourself after your procedure.  Your health care provider may also give you more specific instructions. If you have problems or questions, contact your health care provider. What can I expect after the procedure? After the procedure, it is common to have:  Mild pain.  Slight bruising.  Some swelling over the incision.  A slight bump over the skin where the device was placed. Sometimes, it is possible to feel the device under the skin. This is normal.  Follow these instructions  at home: Medicines  Take over-the-counter and prescription medicines only as told by your health care provider.  If you were prescribed an antibiotic medicine, take it as told by your health care provider. Do not stop taking the antibiotic even if you start to feel better. Wound care  Do not remove the bandage on your chest until directed to do so by your health care provider.  After your bandage is removed, you may see pieces of tape called skin adhesive strips over the area where the cut was made (incision site). Let them fall off on their own.  Check the incision site every day to make sure it is not infected, bleeding, or starting to pull apart.  Do not use lotions or ointments near the incision site unless directed to do so.  Keep the incision area clean and dry for 2-3 days after the procedure or as directed by your health care provider. It takes several weeks for the incision site to completely heal.  Do not take baths, swim, or use a hot tub for 7-10 days or as otherwise directed by your health care provider. Activity  Do not drive or use heavy machinery while taking prescription pain medicine.  Do not drive for 24 hours if you were given a medicine to help you relax (sedative).  Check with your health care provider before you start to drive or play sports.  Avoid sudden jerking, pulling, or chopping movements that pull your upper arm far away from your body. Avoid these movements for at least 6 weeks or as long as told by your  health care provider.  Do not lift your upper arm above your shoulders for at least 6 weeks or as long as told by your health care provider. This means no tennis, golf, or swimming.  You may go back to work when your health care provider says it is okay. Pacemaker care  You may be shown how to transfer data from your pacemaker through the phone to your health care provider.  Always let all health care providers know about your pacemaker before you have any medical procedures or tests.  Wear a medical ID bracelet or necklace stating that you have a pacemaker. Carry a pacemaker ID card with you at all times.  Your pacemaker battery will last for 5-15 years. Routine checks by your health care provider will let the health care provider know when the battery is starting to run down. The pacemaker will need to be replaced when the battery starts to run down.  Do not use amateur Chief of Staff. Other electrical devices are safe to use, including power tools, lawn mowers, and speakers. If you are unsure of whether something is safe to use, ask your health care provider.  When using your cell phone, hold it to the ear opposite the pacemaker. Do not leave your cell phone in a pocket over the pacemaker.  Avoid places or objects that have a strong electric or magnetic field, including: ? Airport Herbalist. When at the airport, let officials know that you have a pacemaker. ? Power plants. ? Large electrical generators. ? Radiofrequency transmission towers, such as cell phone and radio towers. General instructions  Weigh yourself every day. If you suddenly gain weight, fluid may be building up in your body.  Keep all follow-up visits as told by your health care provider. This is important. Contact a health care provider if:  You gain weight suddenly.  Your legs or  feet swell.  It feels like your heart is fluttering or skipping beats (heart palpitations).  You  have chills or a fever.  You have more redness, swelling, or pain around your incisions.  You have more fluid or blood coming from your incisions.  Your incisions feel warm to the touch.  You have pus or a bad smell coming from your incisions. Get help right away if:  You have chest pain.  You have trouble breathing or are short of breath.  You become extremely tired.  You are light-headed or you faint. This information is not intended to replace advice given to you by your health care provider. Make sure you discuss any questions you have with your health care provider. Document Released: 10/14/2004 Document Revised: 01/07/2016 Document Reviewed: 01/07/2016 Elsevier Interactive Patient Education  2018 [place] Discharge Instructions for  Pacemaker/Defibrillator Patients  ACTIVITY No heavy lifting or vigorous activity with your left/right arm for 6 to 8 weeks.  Do not raise your left/right arm above your head for one week.  Gradually raise your affected arm as drawn below.           __  NO DRIVING for     ; you may begin driving on     .  WOUND CARE - Keep the wound area clean and dry.  Do not get this area wet for one week. No showers for one week; you may shower on     . - The tape/steri-strips on your wound will fall off; do not pull them off.  No bandage is needed on the site.  DO  NOT apply any creams, oils, or ointments to the wound area. - If you notice any drainage or discharge from the wound, any swelling or bruising at the site, or you develop a fever > 101? F after you are discharged home, call the office at once.  SPECIAL INSTRUCTIONS - You are still able to use cellular telephones; use the ear opposite the side where you have your pacemaker/defibrillator.  Avoid carrying your cellular phone near your device. - When traveling through airports, show security personnel your identification card to avoid being screened in the metal detectors.   Ask the security personnel to use the hand wand. - Avoid arc welding equipment, MRI testing (magnetic resonance imaging), TENS units (transcutaneous nerve stimulators).  Call the office for questions about other devices. - Avoid electrical appliances that are in poor condition or are not properly grounded. - Microwave ovens are safe to be near or to operate.  ADDITIONAL INFORMATION FOR DEFIBRILLATOR PATIENTS SHOULD YOUR DEVICE GO OFF: - If your device goes off ONCE and you feel fine afterward, notify the device clinic nurses. - If your device goes off ONCE and you do not feel well afterward, call 911. - If your device goes off TWICE, call 911. - If your device goes off [place], call 911.  DO NOT DRIVE YOURSELF OR A FAMILY MEMBER WITH A DEFIBRILLATOR TO THE [facility]-CALL 911.    [facility] - Preparing For Surgery  Before surgery, you can play an important role. Because skin is not sterile, your skin needs to be as free of germs as possible. You can reduce the number of germs on your skin by washing with CHG (chlorahexidine gluconate) Soap before surgery.  CHG is an antiseptic cleaner which kills germs and bonds with the skin to continue killing germs even after washing.   Please do  not use if you have an allergy to CHG or antibacterial soaps.  If your skin becomes reddened/irritated stop using the CHG.   Do not shave (including legs and underarms) for at least 48 hours prior to first CHG shower.  It is OK to shave your face.  Please follow these instructions carefully:  1.  Shower the night before surgery and the morning of surgery with CHG.  2.  If you choose to wash your hair, wash your hair first as usual with your normal shampoo.  3.  After you shampoo, rinse your hair and body thoroughly to remove the shampoo.  4.  Use CHG as you would any other liquid soap.  You can apply CHG directly to the skin and wash gently with a clean washcloth. 5.  Apply the CHG Soap to your  body ONLY FROM THE NECK DOWN.  Do not use on open wounds or open sores.  Avoid contact with your eyes, ears, mouth and genitals (private parts).  Wash genitals (private parts) with your normal soap.  6.  Wash thoroughly, paying special attention to the area where your surgery will be performed.  7.  Thoroughly rise your body with warm water from the neck down.   8.  DO NOT shower/wash with your normal soap after using and rinsing off the CHG soap.  9.  Pat yourself dry with a clean towel.           10.  Wear clean pajamas.           11.  Place clean sheets on your bed the night of your first shower and do not sleep with pets.  Day of Surgery: Do not apply any deodorants/lotions.  Please wear clean clothes to the [facility]/surgery center.

## 2019-08-15 NOTE — Assessment & Plan Note (Signed)
And 1st degree AVB- March 2021

## 2019-08-15 NOTE — Assessment & Plan Note (Signed)
Mild- on Aricept and Wellbutrin

## 2019-08-15 NOTE — Assessment & Plan Note (Signed)
Dec 2019- ? Secondary to dehydration

## 2019-08-15 NOTE — Progress Notes (Signed)
Cardiology Office Note:    Date:  08/15/2019   ID:  [facility], DOB December 20, 1928, MRN [id number]  PCP:  Antony Contras, MD  Cardiologist:  Dr Gardiner Rhyme  Electrophysiologist:  None   Referring MD: Antony Contras, MD   Chief Complaint  Patient presents with  . Follow-up  . Edema    Feet.  bradycardia-fatigue  History of Present Illness:    [facility] is a 86 y.o. 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 Echo showed normal LVF with moderate RVE. The patient was added on to my schedule today after home PT reported a HR of 40 yesterday.  His daughter reports the patient has "good days and bad days".  He has some days where he is fatigued and sleeps a lot. Other days he sees to have more energy.  He had syncope in 2019 but it was felt that was secondary to dehydration. He has trace edema today. He has no chest pain or SOB.  In the office his EKG shows 2:1 AVB with VR 40- he appears to be tolerating this well.   Past Medical History:  Diagnosis Date  . BPH (benign prostatic hyperplasia)   . History of knee replacement   . Hyperlipidemia   . Osteoporosis   . Quadriceps weakness     Past Surgical History:  Procedure Laterality Date  . REPLACEMENT TOTAL KNEE BILATERAL      Current Medications: No outpatient medications have been marked as taking for the 08/15/19 encounter (Office Visit) with Erlene Quan, PA-C.     Allergies:   Patient has no known allergies.   Social History   Socioeconomic History  . Marital status: Widowed    Spouse name: Not on file  . Number of children: Not on file  . Years of education: Not on file  . Highest education level: Not on file  Occupational History  . Not on file  Tobacco Use  . Smoking status: Never Smoker  . Smokeless tobacco: Never Used  Substance and Sexual Activity  . Alcohol use: Not Currently  . Drug use: Not on file  .  Sexual activity: Not on file  Other Topics Concern  . Not on file  Social History Narrative  . Not on file   Social Determinants of Health   Financial Resource Strain:   . Difficulty of Paying Living Expenses:   Food Insecurity:   . Worried About Charity fundraiser in the Last Year:   . Arboriculturist in the Last Year:   Transportation Needs:   . Film/video editor (Medical):   Marland Kitchen Lack of Transportation (Non-Medical):   Physical Activity:   . Days of Exercise per Week:   . Minutes of Exercise per Session:   Stress:   . Feeling of Stress :   Social Connections:   . Frequency of Communication with Friends and Family:   . Frequency of Social Gatherings with Friends and Family:   . Attends Religious Services:   . Active Member of Clubs or Organizations:   . Attends Archivist Meetings:   Marland Kitchen Marital Status:      Family History: The patient's family history is not on file.  ROS:   Please see the history of present illness.     All other systems reviewed and are negative.  EKGs/Labs/Other Studies Reviewed:  The following studies were reviewed today: Echo March 2021  EKG:  EKG is ordered today.  The ekg ordered today demonstrates 2:1 AVB with VR 40, RBBB, LAFB  Recent Labs: No results found for requested labs within last 8760 hours.  Recent Lipid Panel No results found for: CHOL, TRIG, HDL, CHOLHDL, VLDL, LDLCALC, LDLDIRECT  Physical Exam:    VS:  BP 92/60 (BP Location: Left Arm, Patient Position: Sitting, Cuff Size: Normal)   Pulse (!) 42   Temp (!) 97.1 F (36.2 C)   Ht 5\' 10"  (1.778 m)   Wt 168 lb (76.2 kg)   BMI 24.11 kg/m     Wt Readings from Last 3 Encounters:  08/15/19 168 lb (76.2 kg)  07/17/19 162 lb 14.4 oz (73.9 kg)  06/16/19 167 lb 9.6 oz (76 kg)     GEN: Well nourished, well developed in no acute distress HEENT: Normal NECK: No JVD; No carotid bruits CARDIAC: RRR, no murmurs, rubs, gallops RESPIRATORY:  Clear to auscultation  without rales, wheezing or rhonchi  ABDOMEN: Soft, non-tender, non-distended MUSCULOSKELETAL:  No edema; No deformity  SKIN: Warm and dry NEUROLOGIC:  Alert and oriented x 3 PSYCHIATRIC:  Normal affect   ASSESSMENT:    AV block, Mobitz II Symptomatic intermittent type 2 AVB  Dementia (HCC) Mild- on Aricept and Wellbutrin  History of syncope Dec 2019- ? Secondary to dehydration  RBBB with left anterior fascicular block And 1st degree AVB- March 2021  PLAN:    Reviewed with Dr [name] and Dr Percival Spanish- plan for pacemaker Monday.  Risks and benefits explained to the patient and his daughter and they are agreeable to proceed.    Medication Adjustments/Labs and Tests Ordered: Current medicines are reviewed at length with the patient today.  Concerns regarding medicines are outlined above.  Orders Placed This Encounter  Procedures  . EKG 12-Lead   No orders of the defined types were placed in this encounter.   Patient Instructions  Medication Instructions:  Your physician recommends that you continue on your current medications as directed. Please refer to the Current Medication list given to you today.  *If you need a refill on your cardiac medications before your next appointment, please call your pharmacy*   Lab Work: Today (  Covid Test: Today   Testing/Procedures: Pacemaker Monday with Dr. [name]       Signed, Kerin Ransom, PA-C  08/15/2019 10:02 AM    [place]

## 2019-08-15 NOTE — Assessment & Plan Note (Signed)
Symptomatic intermittent type 2 AVB

## 2019-08-15 NOTE — Telephone Encounter (Signed)
Spoke to patients daughter yesterday  Home health nurse came by and checked vitals  HR 40  Then 6os Recommended that she take father to urgent care  I went by to see pt at home  Comfortable in bed (napping)   No SOB  No dizziness  BP 102/76   P 60  Lungs CTA Cardiac RRR  No murmurs Ext with tr edema   Spoke with daughter   She had already contacted our office Appt made for Friday I think a holter monitor 48 to 72 hour would be good to eval for HR regulation.  Dorris Carnes MD

## 2019-08-16 LAB — BASIC METABOLIC PANEL
BUN/Creatinine Ratio: 12 (ref 10–24)
BUN: 14 mg/dL (ref 8–27)
CO2: 24 mmol/L (ref 20–29)
Calcium: 9.2 mg/dL (ref 8.6–10.2)
Chloride: 102 mmol/L (ref 96–106)
Creatinine, Ser: 1.2 mg/dL (ref 0.76–1.27)
GFR calc Af Amer: 62 mL/min/{1.73_m2} (ref >59)
GFR calc non Af Amer: 53 mL/min/{1.73_m2} — ABNORMAL LOW (ref >59)
Glucose: 76 mg/dL (ref 65–99)
Potassium: 4.8 mmol/L (ref 3.5–5.2)
Sodium: 139 mmol/L (ref 134–144)

## 2019-08-16 LAB — CBC WITH DIFFERENTIAL
Basophils Absolute: 0 10*3/uL (ref 0.0–0.2)
Basos: 1 %
EOS (ABSOLUTE): 0.1 10*3/uL (ref 0.0–0.4)
Eos: 1 %
Hematocrit: 45.4 % (ref 37.5–51.0)
Hemoglobin: 15.2 g/dL (ref 13.0–17.7)
Immature Grans (Abs): 0 10*3/uL (ref 0.0–0.1)
Immature Granulocytes: 0 %
Lymphocytes Absolute: 1.2 10*3/uL (ref 0.7–3.1)
Lymphs: 18 %
MCH: 33.2 pg — ABNORMAL HIGH (ref 26.6–33.0)
MCHC: 33.5 g/dL (ref 31.5–35.7)
MCV: 99 fL — ABNORMAL HIGH (ref 79–97)
Monocytes Absolute: 0.5 10*3/uL (ref 0.1–0.9)
Monocytes: 8 %
Neutrophils Absolute: 4.6 10*3/uL (ref 1.4–7.0)
Neutrophils: 72 %
RBC: 4.58 x10E6/uL (ref 4.14–5.80)
RDW: 12.3 % (ref 11.6–15.4)
WBC: 6.5 10*3/uL (ref 3.4–10.8)

## 2019-08-16 LAB — SARS CORONAVIRUS 2 (TAT 6-24 HRS): SARS Coronavirus 2: NEGATIVE

## 2019-08-18 ENCOUNTER — Ambulatory Visit (HOSPITAL_COMMUNITY): Payer: Medicare Other

## 2019-08-18 ENCOUNTER — Encounter (HOSPITAL_COMMUNITY)
Admission: RE | Disposition: A | Discharge status: Home / Self Care | Payer: Self-pay | Source: Home / Self Care | Attending: Internal Medicine

## 2019-08-18 ENCOUNTER — Other Ambulatory Visit: Payer: Self-pay

## 2019-08-18 ENCOUNTER — Ambulatory Visit (HOSPITAL_COMMUNITY)
Admission: RE | Admit: 2019-08-18 | Discharge: 2019-08-18 | Disposition: A | Discharge status: Home / Self Care | Payer: Medicare Other | Attending: Internal Medicine | Admitting: Internal Medicine

## 2019-08-18 DIAGNOSIS — N400 Enlarged prostate without lower urinary tract symptoms: Secondary | ICD-10-CM | POA: Insufficient documentation

## 2019-08-18 DIAGNOSIS — Z950 Presence of cardiac pacemaker: Secondary | ICD-10-CM

## 2019-08-18 DIAGNOSIS — I452 Bifascicular block: Secondary | ICD-10-CM | POA: Diagnosis not present

## 2019-08-18 DIAGNOSIS — E785 Hyperlipidemia, unspecified: Secondary | ICD-10-CM | POA: Diagnosis not present

## 2019-08-18 DIAGNOSIS — I441 Atrioventricular block, second degree: Secondary | ICD-10-CM | POA: Diagnosis not present

## 2019-08-18 DIAGNOSIS — F0390 Unspecified dementia without behavioral disturbance: Secondary | ICD-10-CM | POA: Insufficient documentation

## 2019-08-18 HISTORY — PX: PACEMAKER IMPLANT: EP1218

## 2019-08-18 LAB — GLUCOSE, CAPILLARY: Glucose-Capillary: 90 mg/dL (ref 70–99)

## 2019-08-18 SURGERY — PACEMAKER IMPLANT

## 2019-08-18 MED ORDER — FENTANYL CITRATE (PF) 100 MCG/2ML IJ SOLN
INTRAMUSCULAR | Status: AC
  Filled 2019-08-18: qty 2

## 2019-08-18 MED ORDER — HEPARIN (PORCINE) IN NACL 1000-0.9 UT/500ML-% IV SOLN
INTRAVENOUS | Status: DC | PRN
  Administered 2019-08-18: 500 mL

## 2019-08-18 MED ORDER — ACETAMINOPHEN 325 MG PO TABS
325.0000 mg | ORAL_TABLET | ORAL | Status: DC | PRN
  Administered 2019-08-18: 650 mg via ORAL
  Filled 2019-08-18: qty 2

## 2019-08-18 MED ORDER — CHLORHEXIDINE GLUCONATE 4 % EX LIQD
4.0000 "application " | Freq: Once | CUTANEOUS | Status: DC
  Filled 2019-08-18: qty 60

## 2019-08-18 MED ORDER — FENTANYL CITRATE (PF) 100 MCG/2ML IJ SOLN
INTRAMUSCULAR | Status: DC | PRN
  Administered 2019-08-18 (×2): 12.5 ug via INTRAVENOUS

## 2019-08-18 MED ORDER — SODIUM CHLORIDE 0.9 % IV SOLN: INTRAVENOUS | Status: DC

## 2019-08-18 MED ORDER — SODIUM CHLORIDE 0.9 % IV SOLN
INTRAVENOUS | Status: AC
  Filled 2019-08-18: qty 2

## 2019-08-18 MED ORDER — HEPARIN (PORCINE) IN NACL 1000-0.9 UT/500ML-% IV SOLN
INTRAVENOUS | Status: AC
  Filled 2019-08-18: qty 500

## 2019-08-18 MED ORDER — CEFAZOLIN SODIUM-DEXTROSE 2-4 GM/100ML-% IV SOLN
2.0000 g | INTRAVENOUS | Status: AC
  Administered 2019-08-18: 2 g via INTRAVENOUS

## 2019-08-18 MED ORDER — LIDOCAINE HCL 1 % IJ SOLN
INTRAMUSCULAR | Status: AC
  Filled 2019-08-18: qty 60

## 2019-08-18 MED ORDER — MIDAZOLAM HCL 5 MG/5ML IJ SOLN
INTRAMUSCULAR | Status: AC
  Filled 2019-08-18: qty 5

## 2019-08-18 MED ORDER — BUPIVACAINE HCL (PF) 0.25 % IJ SOLN
INTRAMUSCULAR | Status: DC | PRN
  Administered 2019-08-18: 60 mL

## 2019-08-18 MED ORDER — ONDANSETRON HCL 4 MG/2ML IJ SOLN: 4.0000 mg | Freq: Four times a day (QID) | INTRAMUSCULAR | Status: DC | PRN

## 2019-08-18 MED ORDER — SODIUM CHLORIDE 0.9 % IV SOLN
80.0000 mg | INTRAVENOUS | Status: AC
  Administered 2019-08-18: 80 mg

## 2019-08-18 MED ORDER — CEFAZOLIN SODIUM-DEXTROSE 2-4 GM/100ML-% IV SOLN
INTRAVENOUS | Status: AC
  Filled 2019-08-18: qty 100

## 2019-08-18 MED ORDER — MIDAZOLAM HCL 5 MG/5ML IJ SOLN
INTRAMUSCULAR | Status: DC | PRN
  Administered 2019-08-18 (×2): 1 mg via INTRAVENOUS

## 2019-08-18 MED ORDER — IOHEXOL 350 MG/ML SOLN
INTRAVENOUS | Status: DC | PRN
  Administered 2019-08-18: 10 mL
  Administered 2019-08-18: 50 mL via INTRAVENOUS

## 2019-08-18 MED ORDER — CEFAZOLIN SODIUM-DEXTROSE 1-4 GM/50ML-% IV SOLN
1.0000 g | Freq: Four times a day (QID) | INTRAVENOUS | Status: DC
Start: 2019-08-18 — End: 2019-08-19

## 2019-08-18 SURGICAL SUPPLY — 12 items

## 2019-08-18 NOTE — Progress Notes (Signed)
Patient and son was given discharge instructions. Both verbalized understanding. 

## 2019-08-18 NOTE — Discharge Instructions (Signed)
After Your Pacemaker   . You have a Medtronic  Pacemaker  ACTIVITY . Do not lift your arm above shoulder height for 1 week after your procedure. After 7 days, you may progress as below.     Monday Aug 25, 2019  Tuesday Aug 26, 2019 Wednesday Aug 27, 2019 Thursday Aug 28, 2019   . Do not lift, push, pull, or carry anything over 10 pounds with the affected arm until 6 weeks (Monday September 29, 2019 ) after your procedure.   . Do NOT DRIVE until you have been seen for your wound check, or as long as instructed by your healthcare provider.   . Ask your healthcare provider when you can go back to work   INCISION/Dressing . If you are on a blood thinner such as Coumadin, Xarelto, Eliquis, Plavix, or Pradaxa please confirm with your provider when this should be resumed.  . Monitor your Pacemaker site for redness, swelling, and drainage. Call the device clinic at [phone] if you experience these symptoms or fever/chills.  "If your incision is sealed with Steri-strips or staples, you may shower 10 days after your procedure or when told by your provider. Do not remove the steri-strips or let the shower hit directly on your site. You may wash around your site with soap and water","If your incision is closed with Dermabond/Surgical glue. You may shower 1 day after your pacemaker implant and wash around the site with soap and water"}  . Avoid lotions, ointments, or perfumes over your incision until it is well-healed.  . You may use a hot tub or a pool AFTER your wound check appointment if the incision is completely closed.  Marland Kitchen PAcemaker Alerts:  Some alerts are vibratory and others beep. These are NOT emergencies. Please call our office to let us know. If this occurs at night or on weekends, it can wait until the next business day. Send a remote transmission.  . If your device is capable of reading fluid status (for heart failure), you will be offered monthly monitoring to review this with you.    DEVICE MANAGEMENT . Remote monitoring is used to monitor your pacemaker from home. This monitoring is scheduled every 91 days by our office. It allows [place] to keep an eye on the functioning of your device to ensure it is working properly. You will routinely see your Electrophysiologist annually (more often if necessary).   . You should receive your ID card for your new device in 4-8 weeks. Keep this card with you at all times once received. Consider wearing a medical alert bracelet or necklace.  . Your Pacemaker may be MRI compatible. This will be discussed at your next office visit/wound check.  You should avoid contact with strong electric or magnetic fields.    Do not use amateur (ham) radio equipment or electric (arc) welding torches. MP3 player headphones with magnets should not be used. Some devices are safe to use if held at least 12 inches (30 cm) from your Pacemaker. These include power tools, lawn mowers, and speakers. If you are unsure if something is safe to use, ask your health care provider.   When using your cell phone, hold it to the ear that is on the opposite side from the Pacemaker. Do not leave your cell phone in a pocket over the Pacemaker.   You may safely use electric blankets, heating pads, computers, and microwave ovens.  Call the office right away if:  You have chest pain.  You  feel more short of breath than you have felt before.  You feel more light-headed than you have felt before.  Your incision starts to open up.  This information is not intended to replace advice given to you by your health care provider. Make sure you discuss any questions you have with your health care provider.

## 2019-08-18 NOTE — H&P (Signed)
History of Present Illness:    Steven Mullen is a 86 y.o. 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 Echo showed normal LVF with moderate RVE. The patient was added on to my schedule today after home PT reported a HR of 40 yesterday.  His daughter reports the patient has "good days and bad days".  He has some days where he is fatigued and sleeps a lot. Other days he sees to have more energy.  He had syncope in 2019 but it was felt that was secondary to dehydration. He has trace edema today. He has no chest pain or SOB.  In the office his EKG shows 2:1 AVB with VR 40- he appears to be tolerating this well.       Past Medical History:  Diagnosis Date  . BPH (benign prostatic hyperplasia)   . History of knee replacement   . Hyperlipidemia   . Osteoporosis   . Quadriceps weakness          Past Surgical History:  Procedure Laterality Date  . REPLACEMENT TOTAL KNEE BILATERAL      Current Medications: Active Medications  No outpatient medications have been marked as taking for the 08/15/19 encounter (Office Visit) with Steven Mullen.       Allergies:   Patient has no known allergies.   Social History        Socioeconomic History  . Marital status: Widowed    Spouse name: Not on file  . Number of children: Not on file  . Years of education: Not on file  . Highest education level: Not on file  Occupational History  . Not on file  Tobacco Use  . Smoking status: Never Smoker  . Smokeless tobacco: Never Used  Substance and Sexual Activity  . Alcohol use: Not Currently  . Drug use: Not on file  . Sexual activity: Not on file  Other Topics Concern  . Not on file  Social History Narrative  . Not on file   Social Determinants of Health      Financial Resource Strain:   . Difficulty of Paying Living Expenses:   Food Insecurity:   . Worried About  Charity fundraiser in the Last Year:   . Arboriculturist in the Last Year:   Transportation Needs:   . Film/video editor (Medical):   Marland Kitchen Lack of Transportation (Non-Medical):   Physical Activity:   . Days of Exercise per Week:   . Minutes of Exercise per Session:   Stress:   . Feeling of Stress :   Social Connections:   . Frequency of Communication with Friends and Family:   . Frequency of Social Gatherings with Friends and Family:   . Attends Religious Services:   . Active Member of Clubs or Organizations:   . Attends Archivist Meetings:   Marland Kitchen Marital Status:      Family History: The patient's family history is not on file.  ROS:   Please see the history of present illness.     All other systems reviewed and are negative.  EKGs/Labs/Other Studies Reviewed:    The following studies were reviewed today: Echo March 2021  EKG:  EKG is ordered today.  The ekg ordered today demonstrates 2:1 AVB with VR 40, RBBB, LAFB  Recent Labs: No results found for requested labs  within last 8760 hours.  Recent Lipid Panel Labs (Brief)  No results found for: CHOL, TRIG, HDL, CHOLHDL, VLDL, LDLCALC, LDLDIRECT    Physical Exam:    VS:  BP 92/60 (BP Location: Left Arm, Patient Position: Sitting, Cuff Size: Normal)   Pulse (!) 42   Temp (!) 97.1 F (36.2 C)   Ht 5\' 10"  (1.778 m)   Wt 168 lb (76.2 kg)   BMI 24.11 kg/m        Wt Readings from Last 3 Encounters:  08/15/19 168 lb (76.2 kg)  07/17/19 162 lb 14.4 oz (73.9 kg)  06/16/19 167 lb 9.6 oz (76 kg)     GEN: Well nourished, well developed in no acute distress HEENT: Normal NECK: No JVD; No carotid bruits CARDIAC: RRR, no murmurs, rubs, gallops RESPIRATORY:  Clear to auscultation without rales, wheezing or rhonchi  ABDOMEN: Soft, non-tender, non-distended MUSCULOSKELETAL:  No edema; No deformity  SKIN: Warm and dry NEUROLOGIC:  Alert and oriented x 3 PSYCHIATRIC:  Normal affect   ASSESSMENT:     AV block, Mobitz II Symptomatic intermittent type 2 AVB  Dementia (HCC) Mild- on Aricept and Wellbutrin  History of syncope Dec 2019- ? Secondary to dehydration  RBBB with left anterior fascicular block And 1st degree AVB- March 2021  PLAN:    Reviewed with Steven [name] and Steven Percival Spanish- plan for pacemaker Monday.  Risks and benefits explained to the patient and his daughter and they are agreeable to proceed.    Medication Adjustments/Labs and Tests Ordered: Current medicines are reviewed at length with the patient today.  Concerns regarding medicines are outlined above.     Orders Placed This Encounter  Procedures  . EKG 12-Lead   No orders of the defined types were placed in this encounter.   Patient Instructions  Medication Instructions:  Your physician recommends that you continue on your current medications as directed. Please refer to the Current Medication list given to you today.  *If you need a refill on your cardiac medications before your next appointment, please call your pharmacy*   Lab Work: Today (  Covid Test: Today   Testing/Procedures: Pacemaker Monday with Steven. [name]       Signed, Steven Ransom, Mullen  08/15/2019 10:02 AM    [place]  EP Attending  Patient seen and examined. I discussed the case with [name]. The patient was seen in the office last week with symptomatic 2:1 AV block and RBBB on no AV nodal blocking drugs. He has not had syncope but feels weak and fatigued. He presents today for PPM insertion. I have discussed the indications/risks/beneftis/goals/expectations of PPM insertion with the patient a retired MD and he wishes to proceed.   Steven Mullen.D.

## 2019-08-19 ENCOUNTER — Telehealth: Payer: Self-pay

## 2019-08-19 NOTE — Telephone Encounter (Signed)
-----   Message from Shirley Friar, PA-C sent at 08/19/2019 12:16 PM EDT ----- Same day discharge 08/18/2019 - [facility]  Sorry for late send, got behind on consults and discharges

## 2019-08-19 NOTE — Telephone Encounter (Signed)
Left message for patient daughter, Jeannene Patella (phone number on file belongs to her) requesting callback for follow-up.  Noted manual transmission has not been received, requested in message that she send one if possible.  Provided DC hours and phone number for callback.

## 2019-08-20 DIAGNOSIS — I351 Nonrheumatic aortic (valve) insufficiency: Secondary | ICD-10-CM | POA: Diagnosis not present

## 2019-08-20 DIAGNOSIS — R001 Bradycardia, unspecified: Secondary | ICD-10-CM | POA: Diagnosis not present

## 2019-08-20 DIAGNOSIS — I77819 Aortic ectasia, unspecified site: Secondary | ICD-10-CM | POA: Diagnosis not present

## 2019-08-20 DIAGNOSIS — M545 Low back pain: Secondary | ICD-10-CM | POA: Diagnosis not present

## 2019-08-20 DIAGNOSIS — I371 Nonrheumatic pulmonary valve insufficiency: Secondary | ICD-10-CM | POA: Diagnosis not present

## 2019-08-20 DIAGNOSIS — F0390 Unspecified dementia without behavioral disturbance: Secondary | ICD-10-CM | POA: Diagnosis not present

## 2019-08-20 NOTE — Telephone Encounter (Signed)
Transmission received. I told Kathlene Cote the pt granddaughter that the nurse will call back this afternoon to speak with the pt or Pam who is on the dpr. She verbalized understanding.

## 2019-08-20 NOTE — Telephone Encounter (Signed)
Kathlene Cote calling back stating Pam's phone died and she will not be home until after 5:00pm. Kathlene Cote states to either call her at [phone] or Pam after 5:00pm.

## 2019-08-20 NOTE — Telephone Encounter (Signed)
LMOVM for Pam (DPR) advising of normal transmission results. Requested call back to DC to review discharge instructions. Office hours and direct number provided.

## 2019-08-21 NOTE — Telephone Encounter (Signed)
Spoke with patient's daughter, Jeannene Patella ([place]), to discuss discharge instructions and PPM transmission from 08/20/19. Advised Pam to remove occlusive dressing but leave Steri-strips in place until wound check appointment. Pam denies any wound concerns. Reports patient has had increased stamina since PPM implant. Pam aware of wound check appointment on 5/20 and denies additional questions or concerns at this time.

## 2019-08-21 NOTE — Telephone Encounter (Signed)
The pt daughter states she will be home this afternoon to go over the pt transmission. She requesting a call back.

## 2019-08-22 DIAGNOSIS — L84 Corns and callosities: Secondary | ICD-10-CM | POA: Diagnosis not present

## 2019-08-27 DIAGNOSIS — M545 Low back pain: Secondary | ICD-10-CM | POA: Diagnosis not present

## 2019-08-27 DIAGNOSIS — F0390 Unspecified dementia without behavioral disturbance: Secondary | ICD-10-CM | POA: Diagnosis not present

## 2019-08-27 DIAGNOSIS — I351 Nonrheumatic aortic (valve) insufficiency: Secondary | ICD-10-CM | POA: Diagnosis not present

## 2019-08-27 DIAGNOSIS — I371 Nonrheumatic pulmonary valve insufficiency: Secondary | ICD-10-CM | POA: Diagnosis not present

## 2019-08-27 DIAGNOSIS — I77819 Aortic ectasia, unspecified site: Secondary | ICD-10-CM | POA: Diagnosis not present

## 2019-08-27 DIAGNOSIS — R001 Bradycardia, unspecified: Secondary | ICD-10-CM | POA: Diagnosis not present

## 2019-08-28 ENCOUNTER — Ambulatory Visit (INDEPENDENT_AMBULATORY_CARE_PROVIDER_SITE_OTHER): Payer: Medicare Other | Admitting: Emergency Medicine

## 2019-08-28 ENCOUNTER — Other Ambulatory Visit: Payer: Self-pay

## 2019-08-28 DIAGNOSIS — I441 Atrioventricular block, second degree: Secondary | ICD-10-CM

## 2019-08-28 DIAGNOSIS — R001 Bradycardia, unspecified: Secondary | ICD-10-CM

## 2019-08-29 DIAGNOSIS — I351 Nonrheumatic aortic (valve) insufficiency: Secondary | ICD-10-CM | POA: Diagnosis not present

## 2019-08-29 DIAGNOSIS — F0390 Unspecified dementia without behavioral disturbance: Secondary | ICD-10-CM | POA: Diagnosis not present

## 2019-08-29 DIAGNOSIS — I371 Nonrheumatic pulmonary valve insufficiency: Secondary | ICD-10-CM | POA: Diagnosis not present

## 2019-08-29 DIAGNOSIS — R001 Bradycardia, unspecified: Secondary | ICD-10-CM | POA: Diagnosis not present

## 2019-08-29 DIAGNOSIS — M545 Low back pain: Secondary | ICD-10-CM | POA: Diagnosis not present

## 2019-08-29 DIAGNOSIS — I77819 Aortic ectasia, unspecified site: Secondary | ICD-10-CM | POA: Diagnosis not present

## 2019-08-29 LAB — CUP PACEART INCLINIC DEVICE CHECK
Battery Remaining Longevity: 133 mo
Battery Voltage: 3.22 V
Brady Statistic AP VP Percent: 19.72 %
Brady Statistic AP VS Percent: 0 %
Brady Statistic AS VP Percent: 77.8 %
Brady Statistic AS VS Percent: 2.48 %
Brady Statistic RA Percent Paced: 20.13 %
Brady Statistic RV Percent Paced: 97.52 %
Date Time Interrogation Session: 20210520155300
Implantable Lead Implant Date: 20210510
Implantable Lead Implant Date: 20210510
Implantable Lead Location: 753859
Implantable Lead Location: 753860
Implantable Lead Model: 3830
Implantable Lead Model: 5076
Implantable Pulse Generator Implant Date: 20210510
Lead Channel Impedance Value: 304 Ohm
Lead Channel Impedance Value: 323 Ohm
Lead Channel Impedance Value: 399 Ohm
Lead Channel Impedance Value: 475 Ohm
Lead Channel Pacing Threshold Amplitude: 1 V
Lead Channel Pacing Threshold Amplitude: 1.25 V
Lead Channel Pacing Threshold Pulse Width: 0.4 ms
Lead Channel Pacing Threshold Pulse Width: 0.4 ms
Lead Channel Sensing Intrinsic Amplitude: 2.125 mV
Lead Channel Setting Pacing Amplitude: 3.5 V
Lead Channel Setting Pacing Amplitude: 3.5 V
Lead Channel Setting Pacing Pulse Width: 0.4 ms
Lead Channel Setting Sensing Sensitivity: 1.2 mV

## 2019-08-29 NOTE — Progress Notes (Signed)
Wound check appointment. Steri-strips removed. Wound without redness or edema. Incision edges approximated, wound well healed. Normal device function. Thresholds, sensing, and impedances consistent with implant measurements. Device programmed at 3.5V/auto capture programmed on for extra safety margin until 3 month visit. Histogram distribution appropriate for patient and level of activity. No mode switches or high ventricular rates noted. Patient educated about wound care, arm mobility, lifting restrictions. ROV with Dr [name] November 25, 2019. Next transmission scheduled for 11/18/19 and every 3 months there after.

## 2019-09-02 DIAGNOSIS — I351 Nonrheumatic aortic (valve) insufficiency: Secondary | ICD-10-CM | POA: Diagnosis not present

## 2019-09-02 DIAGNOSIS — I77819 Aortic ectasia, unspecified site: Secondary | ICD-10-CM | POA: Diagnosis not present

## 2019-09-02 DIAGNOSIS — I371 Nonrheumatic pulmonary valve insufficiency: Secondary | ICD-10-CM | POA: Diagnosis not present

## 2019-09-02 DIAGNOSIS — M545 Low back pain: Secondary | ICD-10-CM | POA: Diagnosis not present

## 2019-09-02 DIAGNOSIS — F0390 Unspecified dementia without behavioral disturbance: Secondary | ICD-10-CM | POA: Diagnosis not present

## 2019-09-02 DIAGNOSIS — R001 Bradycardia, unspecified: Secondary | ICD-10-CM | POA: Diagnosis not present

## 2019-09-05 DIAGNOSIS — R001 Bradycardia, unspecified: Secondary | ICD-10-CM | POA: Diagnosis not present

## 2019-09-05 DIAGNOSIS — M545 Low back pain: Secondary | ICD-10-CM | POA: Diagnosis not present

## 2019-09-05 DIAGNOSIS — E559 Vitamin D deficiency, unspecified: Secondary | ICD-10-CM | POA: Diagnosis not present

## 2019-09-05 DIAGNOSIS — I77819 Aortic ectasia, unspecified site: Secondary | ICD-10-CM | POA: Diagnosis not present

## 2019-09-05 DIAGNOSIS — F325 Major depressive disorder, single episode, in full remission: Secondary | ICD-10-CM | POA: Diagnosis not present

## 2019-09-05 DIAGNOSIS — F0390 Unspecified dementia without behavioral disturbance: Secondary | ICD-10-CM | POA: Diagnosis not present

## 2019-09-05 DIAGNOSIS — N400 Enlarged prostate without lower urinary tract symptoms: Secondary | ICD-10-CM | POA: Diagnosis not present

## 2019-09-05 DIAGNOSIS — M810 Age-related osteoporosis without current pathological fracture: Secondary | ICD-10-CM | POA: Diagnosis not present

## 2019-09-05 DIAGNOSIS — I371 Nonrheumatic pulmonary valve insufficiency: Secondary | ICD-10-CM | POA: Diagnosis not present

## 2019-09-05 DIAGNOSIS — E785 Hyperlipidemia, unspecified: Secondary | ICD-10-CM | POA: Diagnosis not present

## 2019-09-09 DIAGNOSIS — R001 Bradycardia, unspecified: Secondary | ICD-10-CM | POA: Diagnosis not present

## 2019-09-09 DIAGNOSIS — I77819 Aortic ectasia, unspecified site: Secondary | ICD-10-CM | POA: Diagnosis not present

## 2019-09-09 DIAGNOSIS — M545 Low back pain: Secondary | ICD-10-CM | POA: Diagnosis not present

## 2019-09-09 DIAGNOSIS — I351 Nonrheumatic aortic (valve) insufficiency: Secondary | ICD-10-CM | POA: Diagnosis not present

## 2019-09-09 DIAGNOSIS — F0390 Unspecified dementia without behavioral disturbance: Secondary | ICD-10-CM | POA: Diagnosis not present

## 2019-09-09 DIAGNOSIS — I371 Nonrheumatic pulmonary valve insufficiency: Secondary | ICD-10-CM | POA: Diagnosis not present

## 2019-09-11 DIAGNOSIS — I77819 Aortic ectasia, unspecified site: Secondary | ICD-10-CM | POA: Diagnosis not present

## 2019-09-11 DIAGNOSIS — I351 Nonrheumatic aortic (valve) insufficiency: Secondary | ICD-10-CM | POA: Diagnosis not present

## 2019-09-11 DIAGNOSIS — M545 Low back pain: Secondary | ICD-10-CM | POA: Diagnosis not present

## 2019-09-11 DIAGNOSIS — R001 Bradycardia, unspecified: Secondary | ICD-10-CM | POA: Diagnosis not present

## 2019-09-11 DIAGNOSIS — F0390 Unspecified dementia without behavioral disturbance: Secondary | ICD-10-CM | POA: Diagnosis not present

## 2019-09-11 DIAGNOSIS — N400 Enlarged prostate without lower urinary tract symptoms: Secondary | ICD-10-CM | POA: Diagnosis not present

## 2019-09-11 DIAGNOSIS — E559 Vitamin D deficiency, unspecified: Secondary | ICD-10-CM | POA: Diagnosis not present

## 2019-09-11 DIAGNOSIS — F325 Major depressive disorder, single episode, in full remission: Secondary | ICD-10-CM | POA: Diagnosis not present

## 2019-09-11 DIAGNOSIS — E785 Hyperlipidemia, unspecified: Secondary | ICD-10-CM | POA: Diagnosis not present

## 2019-09-11 DIAGNOSIS — M810 Age-related osteoporosis without current pathological fracture: Secondary | ICD-10-CM | POA: Diagnosis not present

## 2019-09-11 DIAGNOSIS — I371 Nonrheumatic pulmonary valve insufficiency: Secondary | ICD-10-CM | POA: Diagnosis not present

## 2019-09-18 DIAGNOSIS — F0390 Unspecified dementia without behavioral disturbance: Secondary | ICD-10-CM | POA: Diagnosis not present

## 2019-09-18 DIAGNOSIS — R001 Bradycardia, unspecified: Secondary | ICD-10-CM | POA: Diagnosis not present

## 2019-09-18 DIAGNOSIS — I77819 Aortic ectasia, unspecified site: Secondary | ICD-10-CM | POA: Diagnosis not present

## 2019-09-18 DIAGNOSIS — I371 Nonrheumatic pulmonary valve insufficiency: Secondary | ICD-10-CM | POA: Diagnosis not present

## 2019-09-18 DIAGNOSIS — M545 Low back pain: Secondary | ICD-10-CM | POA: Diagnosis not present

## 2019-09-18 DIAGNOSIS — I351 Nonrheumatic aortic (valve) insufficiency: Secondary | ICD-10-CM | POA: Diagnosis not present

## 2019-09-26 DIAGNOSIS — I371 Nonrheumatic pulmonary valve insufficiency: Secondary | ICD-10-CM | POA: Diagnosis not present

## 2019-09-26 DIAGNOSIS — M545 Low back pain: Secondary | ICD-10-CM | POA: Diagnosis not present

## 2019-09-26 DIAGNOSIS — I77819 Aortic ectasia, unspecified site: Secondary | ICD-10-CM | POA: Diagnosis not present

## 2019-09-26 DIAGNOSIS — R001 Bradycardia, unspecified: Secondary | ICD-10-CM | POA: Diagnosis not present

## 2019-09-26 DIAGNOSIS — I351 Nonrheumatic aortic (valve) insufficiency: Secondary | ICD-10-CM | POA: Diagnosis not present

## 2019-09-26 DIAGNOSIS — F0390 Unspecified dementia without behavioral disturbance: Secondary | ICD-10-CM | POA: Diagnosis not present

## 2019-10-01 DIAGNOSIS — M545 Low back pain: Secondary | ICD-10-CM | POA: Diagnosis not present

## 2019-10-01 DIAGNOSIS — F0390 Unspecified dementia without behavioral disturbance: Secondary | ICD-10-CM | POA: Diagnosis not present

## 2019-10-01 DIAGNOSIS — I351 Nonrheumatic aortic (valve) insufficiency: Secondary | ICD-10-CM | POA: Diagnosis not present

## 2019-10-01 DIAGNOSIS — R001 Bradycardia, unspecified: Secondary | ICD-10-CM | POA: Diagnosis not present

## 2019-10-01 DIAGNOSIS — I371 Nonrheumatic pulmonary valve insufficiency: Secondary | ICD-10-CM | POA: Diagnosis not present

## 2019-10-01 DIAGNOSIS — I77819 Aortic ectasia, unspecified site: Secondary | ICD-10-CM | POA: Diagnosis not present

## 2019-10-08 DIAGNOSIS — I371 Nonrheumatic pulmonary valve insufficiency: Secondary | ICD-10-CM | POA: Diagnosis not present

## 2019-10-08 DIAGNOSIS — F0390 Unspecified dementia without behavioral disturbance: Secondary | ICD-10-CM | POA: Diagnosis not present

## 2019-10-08 DIAGNOSIS — M545 Low back pain: Secondary | ICD-10-CM | POA: Diagnosis not present

## 2019-10-08 DIAGNOSIS — I351 Nonrheumatic aortic (valve) insufficiency: Secondary | ICD-10-CM | POA: Diagnosis not present

## 2019-10-08 DIAGNOSIS — R001 Bradycardia, unspecified: Secondary | ICD-10-CM | POA: Diagnosis not present

## 2019-10-08 DIAGNOSIS — I77819 Aortic ectasia, unspecified site: Secondary | ICD-10-CM | POA: Diagnosis not present

## 2019-10-31 DIAGNOSIS — Z7189 Other specified counseling: Secondary | ICD-10-CM | POA: Diagnosis not present

## 2019-10-31 DIAGNOSIS — F325 Major depressive disorder, single episode, in full remission: Secondary | ICD-10-CM | POA: Diagnosis not present

## 2019-10-31 DIAGNOSIS — E559 Vitamin D deficiency, unspecified: Secondary | ICD-10-CM | POA: Diagnosis not present

## 2019-10-31 DIAGNOSIS — I77819 Aortic ectasia, unspecified site: Secondary | ICD-10-CM | POA: Diagnosis not present

## 2019-10-31 DIAGNOSIS — Z87898 Personal history of other specified conditions: Secondary | ICD-10-CM | POA: Diagnosis not present

## 2019-10-31 DIAGNOSIS — R110 Nausea: Secondary | ICD-10-CM | POA: Diagnosis not present

## 2019-10-31 DIAGNOSIS — G3184 Mild cognitive impairment, so stated: Secondary | ICD-10-CM | POA: Diagnosis not present

## 2019-10-31 DIAGNOSIS — Z950 Presence of cardiac pacemaker: Secondary | ICD-10-CM | POA: Diagnosis not present

## 2019-10-31 DIAGNOSIS — M545 Low back pain: Secondary | ICD-10-CM | POA: Diagnosis not present

## 2019-11-18 ENCOUNTER — Ambulatory Visit (INDEPENDENT_AMBULATORY_CARE_PROVIDER_SITE_OTHER): Payer: Medicare Other | Admitting: *Deleted

## 2019-11-18 DIAGNOSIS — I441 Atrioventricular block, second degree: Secondary | ICD-10-CM

## 2019-11-18 LAB — CUP PACEART REMOTE DEVICE CHECK
Battery Remaining Longevity: 111 mo
Battery Voltage: 3.18 V
Brady Statistic AP VP Percent: 27.88 %
Brady Statistic AP VS Percent: 0 %
Brady Statistic AS VP Percent: 70.12 %
Brady Statistic AS VS Percent: 2 %
Brady Statistic RA Percent Paced: 28.72 %
Brady Statistic RV Percent Paced: 98 %
Date Time Interrogation Session: 20210809191230
Implantable Lead Implant Date: 20210510
Implantable Lead Implant Date: 20210510
Implantable Lead Location: 753859
Implantable Lead Location: 753860
Implantable Lead Model: 3830
Implantable Lead Model: 5076
Implantable Pulse Generator Implant Date: 20210510
Lead Channel Impedance Value: 323 Ohm
Lead Channel Impedance Value: 342 Ohm
Lead Channel Impedance Value: 475 Ohm
Lead Channel Impedance Value: 494 Ohm
Lead Channel Pacing Threshold Amplitude: 0.75 V
Lead Channel Pacing Threshold Amplitude: 0.875 V
Lead Channel Pacing Threshold Pulse Width: 0.4 ms
Lead Channel Pacing Threshold Pulse Width: 0.4 ms
Lead Channel Sensing Intrinsic Amplitude: 2.875 mV
Lead Channel Sensing Intrinsic Amplitude: 2.875 mV
Lead Channel Sensing Intrinsic Amplitude: 9.875 mV
Lead Channel Sensing Intrinsic Amplitude: 9.875 mV
Lead Channel Setting Pacing Amplitude: 3.25 V
Lead Channel Setting Pacing Amplitude: 3.25 V
Lead Channel Setting Pacing Pulse Width: 0.4 ms
Lead Channel Setting Sensing Sensitivity: 1.2 mV

## 2019-11-19 DIAGNOSIS — L84 Corns and callosities: Secondary | ICD-10-CM | POA: Diagnosis not present

## 2019-11-20 DIAGNOSIS — F331 Major depressive disorder, recurrent, moderate: Secondary | ICD-10-CM | POA: Diagnosis not present

## 2019-11-20 NOTE — Progress Notes (Signed)
Remote pacemaker transmission.   

## 2019-11-25 ENCOUNTER — Other Ambulatory Visit: Payer: Self-pay

## 2019-11-25 ENCOUNTER — Ambulatory Visit (INDEPENDENT_AMBULATORY_CARE_PROVIDER_SITE_OTHER): Payer: Medicare Other | Admitting: Internal Medicine

## 2019-11-25 ENCOUNTER — Encounter: Payer: Self-pay | Admitting: Internal Medicine

## 2019-11-25 VITALS — BP 122/66 | HR 76 | Ht 70.0 in | Wt 164.6 lb

## 2019-11-25 DIAGNOSIS — I441 Atrioventricular block, second degree: Secondary | ICD-10-CM

## 2019-11-25 DIAGNOSIS — Z950 Presence of cardiac pacemaker: Secondary | ICD-10-CM | POA: Diagnosis not present

## 2019-11-25 NOTE — Progress Notes (Signed)
HPI Steven Mullen returns today for followup. 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 He also has a h/o peripheral edema. His daughter is with him and notes that he has been sleeping more and his appetite down, at times sleeping through dinner. He admits to being sedentary and has not been too interested in trying to exercise. No Known Allergies   Current Outpatient Medications  Medication Sig Dispense Refill  . acetaminophen (TYLENOL) 325 MG tablet Take 325 mg by mouth 2 (two) times daily as needed for moderate pain or headache.    Marland Kitchen buPROPion (WELLBUTRIN XL) 150 MG 24 hr tablet Take 150 mg by mouth every morning.    . Cholecalciferol (VITAMIN D) 50 MCG (2000 UT) tablet Take 2,000 Units by mouth daily.    Marland Kitchen donepezil (ARICEPT) 5 MG tablet Take 5 mg by mouth at bedtime.     . Multiple Vitamin (MULTIVITAMIN) tablet Take 1 tablet by mouth daily.     . ondansetron (ZOFRAN) 4 MG tablet Take 4 mg by mouth every 8 (eight) hours as needed for nausea or vomiting.     No current facility-administered medications for this visit.     Past Medical History:  Diagnosis Date  . BPH (benign prostatic hyperplasia)   . History of knee replacement   . Hyperlipidemia   . Osteoporosis   . Quadriceps weakness     ROS:   All systems reviewed and negative except as noted in the HPI.   Past Surgical History:  Procedure Laterality Date  . PACEMAKER IMPLANT N/A 08/18/2019   Procedure: PACEMAKER IMPLANT;  Surgeon: Evans Lance, MD;  Location: [facility];  Service: Cardiovascular;  Laterality: N/A;  . REPLACEMENT TOTAL KNEE BILATERAL       History reviewed. No pertinent family history.   Social History   Socioeconomic History  . Marital status: Widowed    Spouse name: Not on file  . Number of children: Not on file  . Years of education: Not on file  . Highest education level: Not on file  Occupational History  . Not on file  Tobacco Use   . Smoking status: Never Smoker  . Smokeless tobacco: Never Used  Vaping Use  . Vaping Use: Never used  Substance and Sexual Activity  . Alcohol use: Not Currently  . Drug use: Not on file  . Sexual activity: Not on file  Other Topics Concern  . Not on file  Social History Narrative  . Not on file   Social Determinants of Health   Financial Resource Strain:   . Difficulty of Paying Living Expenses:   Food Insecurity:   . Worried About Charity fundraiser in the Last Year:   . Arboriculturist in the Last Year:   Transportation Needs:   . Film/video editor (Medical):   Marland Kitchen Lack of Transportation (Non-Medical):   Physical Activity:   . Days of Exercise per Week:   . Minutes of Exercise per Session:   Stress:   . Feeling of Stress :   Social Connections:   . Frequency of Communication with Friends and Family:   . Frequency of Social Gatherings with Friends and Family:   . Attends Religious Services:   . Active Member of Clubs or Organizations:   . Attends Archivist Meetings:   Marland Kitchen Marital Status:   Intimate Partner Violence:   . Fear of Current or  Ex-Partner:   . Emotionally Abused:   Marland Kitchen Physically Abused:   . Sexually Abused:      BP 122/66   Pulse 76   Ht 5\' 10"  (1.778 m)   Wt 164 lb 9.6 oz (74.7 kg)   SpO2 93%   BMI 23.62 kg/m   Physical Exam:  Well appearing elderly man, NAD HEENT: Unremarkable Neck:  6 cm JVD, no thyromegally Lymphatics:  No adenopathy Back:  No CVA tenderness Lungs:  Clear with no wheezes;well healed PPM incision HEART:  Regular rate rhythm, no murmurs, no rubs, no clicks Abd:  soft, positive bowel sounds, no organomegally, no rebound, no guarding Ext:  2 plus pulses, no edema, no cyanosis, no clubbing Skin:  No rashes no nodules Neuro:  CN II through XII intact, motor grossly intact  EKG - nsr with ventricular pacing  DEVICE  Normal device function.  See PaceArt for details.   Assess/Plan: 1. CHB - he is s/p DDD PM  insertion and is doing well. 2. PPM - his medtronic DDD PM is working normally. We will recheck in several months.  3. Sedentary lifestyle - I have encouraged the patient to increase his physical activity.  Steven Mullen.

## 2019-11-25 NOTE — Patient Instructions (Signed)
Medication Instructions:  Your physician recommends that you continue on your current medications as directed. Please refer to the Current Medication list given to you today.  Labwork: None ordered.  Testing/Procedures: None ordered.  Follow-Up: Your physician wants you to follow-up in: one year with Dr. [name].   You will receive a reminder letter in the mail two months in advance. If you don't receive a letter, please call our office to schedule the follow-up appointment.  Remote monitoring is used to monitor your Pacemaker from home. This monitoring reduces the number of office visits required to check your device to one time per year. It allows [place] to keep an eye on the functioning of your device to ensure it is working properly. You are scheduled for a device check from home on 02/17/2020. You may send your transmission at any time that day. If you have a wireless device, the transmission will be sent automatically. After your physician reviews your transmission, you will receive a postcard with your next transmission date.  Any Other Special Instructions Will Be Listed Below (If Applicable).  If you need a refill on your cardiac medications before your next appointment, please call your pharmacy.

## 2020-01-09 DIAGNOSIS — Z23 Encounter for immunization: Secondary | ICD-10-CM | POA: Diagnosis not present

## 2020-01-09 DIAGNOSIS — E559 Vitamin D deficiency, unspecified: Secondary | ICD-10-CM | POA: Diagnosis not present

## 2020-01-09 DIAGNOSIS — N1831 Chronic kidney disease, stage 3a: Secondary | ICD-10-CM | POA: Diagnosis not present

## 2020-02-17 ENCOUNTER — Ambulatory Visit (INDEPENDENT_AMBULATORY_CARE_PROVIDER_SITE_OTHER): Payer: Medicare Other

## 2020-02-17 DIAGNOSIS — I441 Atrioventricular block, second degree: Secondary | ICD-10-CM | POA: Diagnosis not present

## 2020-02-17 LAB — CUP PACEART REMOTE DEVICE CHECK
Battery Remaining Longevity: 135 mo
Battery Voltage: 3.16 V
Brady Statistic AP VP Percent: 33.31 %
Brady Statistic AP VS Percent: 0 %
Brady Statistic AS VP Percent: 61.77 %
Brady Statistic AS VS Percent: 4.92 %
Brady Statistic RA Percent Paced: 36.18 %
Brady Statistic RV Percent Paced: 95.08 %
Date Time Interrogation Session: 20211108180417
Implantable Lead Implant Date: 20210510
Implantable Lead Implant Date: 20210510
Implantable Lead Location: 753859
Implantable Lead Location: 753860
Implantable Lead Model: 3830
Implantable Lead Model: 5076
Implantable Pulse Generator Implant Date: 20210510
Lead Channel Impedance Value: 342 Ohm
Lead Channel Impedance Value: 361 Ohm
Lead Channel Impedance Value: 456 Ohm
Lead Channel Impedance Value: 456 Ohm
Lead Channel Pacing Threshold Amplitude: 0.875 V
Lead Channel Pacing Threshold Amplitude: 1 V
Lead Channel Pacing Threshold Pulse Width: 0.4 ms
Lead Channel Pacing Threshold Pulse Width: 0.4 ms
Lead Channel Sensing Intrinsic Amplitude: 10.25 mV
Lead Channel Sensing Intrinsic Amplitude: 10.25 mV
Lead Channel Sensing Intrinsic Amplitude: 2.875 mV
Lead Channel Sensing Intrinsic Amplitude: 2.875 mV
Lead Channel Setting Pacing Amplitude: 1.75 V
Lead Channel Setting Pacing Amplitude: 2.25 V
Lead Channel Setting Pacing Pulse Width: 0.4 ms
Lead Channel Setting Sensing Sensitivity: 1.2 mV

## 2020-02-19 NOTE — Progress Notes (Signed)
Remote pacemaker transmission.   

## 2020-02-20 DIAGNOSIS — L84 Corns and callosities: Secondary | ICD-10-CM | POA: Diagnosis not present

## 2020-05-18 ENCOUNTER — Ambulatory Visit (INDEPENDENT_AMBULATORY_CARE_PROVIDER_SITE_OTHER): Payer: Medicare Other

## 2020-05-18 DIAGNOSIS — I441 Atrioventricular block, second degree: Secondary | ICD-10-CM | POA: Diagnosis not present

## 2020-05-18 LAB — CUP PACEART REMOTE DEVICE CHECK
Battery Remaining Longevity: 117 mo
Battery Voltage: 3.09 V
Brady Statistic AP VP Percent: 24.94 %
Brady Statistic AP VS Percent: 0 %
Brady Statistic AS VP Percent: 66.82 %
Brady Statistic AS VS Percent: 8.24 %
Brady Statistic RA Percent Paced: 28.55 %
Brady Statistic RV Percent Paced: 91.76 %
Date Time Interrogation Session: 20220208055741
Implantable Lead Implant Date: 20210510
Implantable Lead Implant Date: 20210510
Implantable Lead Location: 753859
Implantable Lead Location: 753860
Implantable Lead Model: 3830
Implantable Lead Model: 5076
Implantable Pulse Generator Implant Date: 20210510
Lead Channel Impedance Value: 342 Ohm
Lead Channel Impedance Value: 361 Ohm
Lead Channel Impedance Value: 475 Ohm
Lead Channel Impedance Value: 494 Ohm
Lead Channel Pacing Threshold Amplitude: 0.75 V
Lead Channel Pacing Threshold Amplitude: 1.25 V
Lead Channel Pacing Threshold Pulse Width: 0.4 ms
Lead Channel Pacing Threshold Pulse Width: 0.4 ms
Lead Channel Sensing Intrinsic Amplitude: 2.375 mV
Lead Channel Sensing Intrinsic Amplitude: 2.375 mV
Lead Channel Sensing Intrinsic Amplitude: 7.25 mV
Lead Channel Sensing Intrinsic Amplitude: 7.25 mV
Lead Channel Setting Pacing Amplitude: 1.5 V
Lead Channel Setting Pacing Amplitude: 3 V
Lead Channel Setting Pacing Pulse Width: 0.4 ms
Lead Channel Setting Sensing Sensitivity: 1.2 mV

## 2020-05-24 NOTE — Progress Notes (Signed)
Remote pacemaker transmission.   

## 2020-07-14 ENCOUNTER — Telehealth (HOSPITAL_COMMUNITY): Payer: Self-pay | Admitting: Cardiology

## 2020-07-14 NOTE — Telephone Encounter (Signed)
07/07/20 Daughter not ready to schedule Echocardiogram due to she doesnt want to put him thru this and she will call Dr. Gardiner Rhyme office to check with dr. Armanda Heritage 10:51 Order will be removed from the [facility] and if calls back to reschedule we will reinstate the order.

## 2020-07-27 DIAGNOSIS — L84 Corns and callosities: Secondary | ICD-10-CM | POA: Diagnosis not present

## 2020-08-04 ENCOUNTER — Telehealth: Payer: Self-pay | Admitting: Cardiology

## 2020-08-04 NOTE — Telephone Encounter (Signed)
The order is placed for the ECHO- does not expire until 10/2020. Will route to scheduling.

## 2020-08-04 NOTE — Telephone Encounter (Signed)
I scheduled the patient for 09/14/20 with Dr. Gardiner Rhyme. Per recall note, patient needs an order for an echo prior to this appointment.

## 2020-08-09 DIAGNOSIS — F331 Major depressive disorder, recurrent, moderate: Secondary | ICD-10-CM | POA: Diagnosis not present

## 2020-08-13 ENCOUNTER — Other Ambulatory Visit: Payer: Self-pay

## 2020-08-13 DIAGNOSIS — I371 Nonrheumatic pulmonary valve insufficiency: Secondary | ICD-10-CM

## 2020-08-13 DIAGNOSIS — I351 Nonrheumatic aortic (valve) insufficiency: Secondary | ICD-10-CM

## 2020-08-17 ENCOUNTER — Ambulatory Visit (INDEPENDENT_AMBULATORY_CARE_PROVIDER_SITE_OTHER): Payer: Medicare Other

## 2020-08-17 DIAGNOSIS — I441 Atrioventricular block, second degree: Secondary | ICD-10-CM

## 2020-08-17 LAB — CUP PACEART REMOTE DEVICE CHECK
Battery Remaining Longevity: 113 mo
Battery Voltage: 3.04 V
Brady Statistic AP VP Percent: 22.45 %
Brady Statistic AP VS Percent: 0.03 %
Brady Statistic AS VP Percent: 67.48 %
Brady Statistic AS VS Percent: 10.04 %
Brady Statistic RA Percent Paced: 26.99 %
Brady Statistic RV Percent Paced: 89.94 %
Date Time Interrogation Session: 20220509195339
Implantable Lead Implant Date: 20210510
Implantable Lead Implant Date: 20210510
Implantable Lead Location: 753859
Implantable Lead Location: 753860
Implantable Lead Model: 3830
Implantable Lead Model: 5076
Implantable Pulse Generator Implant Date: 20210510
Lead Channel Impedance Value: 323 Ohm
Lead Channel Impedance Value: 342 Ohm
Lead Channel Impedance Value: 456 Ohm
Lead Channel Impedance Value: 513 Ohm
Lead Channel Pacing Threshold Amplitude: 0.875 V
Lead Channel Pacing Threshold Amplitude: 1.25 V
Lead Channel Pacing Threshold Pulse Width: 0.4 ms
Lead Channel Pacing Threshold Pulse Width: 0.4 ms
Lead Channel Sensing Intrinsic Amplitude: 3 mV
Lead Channel Sensing Intrinsic Amplitude: 3 mV
Lead Channel Sensing Intrinsic Amplitude: 5.875 mV
Lead Channel Sensing Intrinsic Amplitude: 5.875 mV
Lead Channel Setting Pacing Amplitude: 1.75 V
Lead Channel Setting Pacing Amplitude: 3 V
Lead Channel Setting Pacing Pulse Width: 0.4 ms
Lead Channel Setting Sensing Sensitivity: 1.2 mV

## 2020-08-18 DIAGNOSIS — R32 Unspecified urinary incontinence: Secondary | ICD-10-CM | POA: Diagnosis not present

## 2020-08-18 DIAGNOSIS — R634 Abnormal weight loss: Secondary | ICD-10-CM | POA: Diagnosis not present

## 2020-08-20 DIAGNOSIS — R32 Unspecified urinary incontinence: Secondary | ICD-10-CM | POA: Diagnosis not present

## 2020-09-07 ENCOUNTER — Ambulatory Visit (HOSPITAL_COMMUNITY): Payer: Medicare Other | Attending: Internal Medicine

## 2020-09-07 ENCOUNTER — Other Ambulatory Visit: Payer: Self-pay

## 2020-09-07 DIAGNOSIS — I351 Nonrheumatic aortic (valve) insufficiency: Secondary | ICD-10-CM | POA: Diagnosis not present

## 2020-09-07 DIAGNOSIS — I371 Nonrheumatic pulmonary valve insufficiency: Secondary | ICD-10-CM | POA: Diagnosis not present

## 2020-09-07 LAB — ECHOCARDIOGRAM COMPLETE
Area-P 1/2: 3 cm2
P 1/2 time: 611 msec
S' Lateral: 2.63 cm

## 2020-09-08 NOTE — Progress Notes (Signed)
Remote pacemaker transmission.   

## 2020-09-14 ENCOUNTER — Ambulatory Visit (INDEPENDENT_AMBULATORY_CARE_PROVIDER_SITE_OTHER): Payer: Medicare Other | Admitting: Cardiology

## 2020-09-14 ENCOUNTER — Other Ambulatory Visit: Payer: Self-pay

## 2020-09-14 ENCOUNTER — Encounter: Payer: Self-pay | Admitting: Cardiology

## 2020-09-14 VITALS — BP 118/78 | HR 84 | Ht 71.0 in | Wt 164.0 lb

## 2020-09-14 DIAGNOSIS — Z950 Presence of cardiac pacemaker: Secondary | ICD-10-CM | POA: Diagnosis not present

## 2020-09-14 DIAGNOSIS — R399 Unspecified symptoms and signs involving the genitourinary system: Secondary | ICD-10-CM | POA: Diagnosis not present

## 2020-09-14 DIAGNOSIS — I441 Atrioventricular block, second degree: Secondary | ICD-10-CM | POA: Diagnosis not present

## 2020-09-14 DIAGNOSIS — R001 Bradycardia, unspecified: Secondary | ICD-10-CM

## 2020-09-14 DIAGNOSIS — R5383 Other fatigue: Secondary | ICD-10-CM

## 2020-09-14 NOTE — Progress Notes (Signed)
Cardiology Office Note:    Date:  09/14/2020   ID:  Steven Mullen, DOB 1929/01/31, MRN [id number]  PCP:  Steven Contras, MD  Cardiologist:  None  Electrophysiologist:  None   Referring MD: Steven Contras, MD   Chief Complaint  Patient presents with  . Bradycardia    History of Present Illness:    Steven Mullen is a 87 y.o. male with a hx of symptomatic bradycardia status post PPM, BPH, hyperlipidemia, dementia who presents for follow-up.  He was referred by Dr. Moreen Mullen for evaluation of lower extremity edema, initially seen on 06/16/2019.  He had an admission to [facility] from 03/22/2018 through 03/25/2018 with syncope.  TTE showed normal LV systolic function.  Etiology of syncope was thought to be dehydration, vasovagal.  TTE was done on 07/01/2019, which showed EF 55 to 77%, normal diastolic function, moderately enlarged RV, mild to moderate pulmonic regurgitation, mild aortic regurgitation, mild ascending aorta dilatation measuring 39 mm.  Subsequently found to have complete heart block and underwent PPM insertion 08/2019.  Echocardiogram on 09/08/2019 showed LVEF 50 to 41%, grade 1 diastolic dysfunction, normal RV function, mild dilatation of acing aorta measuring 39 mm, no significant valvular disease.  Today, he is accompanied by his daughter, who also provides some history. Since last clinic visit, he has been feeling well. He is still easily fatigued and takes several naps a day. Since his pacemaker was implanted, he has gained more stability in his daily routine. He uses a walker at home and is now able to use the restroom and go back to the bedroom independently. He denies any chest pain, shortness of breath, palpitations, or exertional symptoms. No headaches, lightheadedness, or syncope to report. Also has no lower extremity edema, orthopnea or PND.    Past Medical History:  Diagnosis Date  . BPH (benign prostatic hyperplasia)   . History of knee replacement   . Hyperlipidemia   .  Osteoporosis   . Quadriceps weakness     Past Surgical History:  Procedure Laterality Date  . PACEMAKER IMPLANT N/A 08/18/2019   Procedure: PACEMAKER IMPLANT;  Surgeon: Steven Lance, MD;  Location: [facility];  Service: Cardiovascular;  Laterality: N/A;  . REPLACEMENT TOTAL KNEE BILATERAL      Current Medications: Current Meds  Medication Sig  . acetaminophen (TYLENOL) 325 MG tablet Take 325 mg by mouth 2 (two) times daily as needed for moderate pain or headache.  Marland Kitchen buPROPion (WELLBUTRIN XL) 150 MG 24 hr tablet Take 150 mg by mouth every morning.  . Cholecalciferol (VITAMIN D) 50 MCG (2000 UT) tablet Take 2,000 Units by mouth daily.  Marland Kitchen donepezil (ARICEPT) 5 MG tablet Take 5 mg by mouth at bedtime.   . Multiple Vitamins-Minerals (CENTRUM SILVER) tablet 1 tablet  . ondansetron (ZOFRAN) 4 MG tablet Take 4 mg by mouth every 8 (eight) hours as needed for nausea or vomiting.     Allergies:   Patient has no known allergies.   Social History   Socioeconomic History  . Marital status: Widowed    Spouse name: Not on file  . Number of children: Not on file  . Years of education: Not on file  . Highest education level: Not on file  Occupational History  . Not on file  Tobacco Use  . Smoking status: Never Smoker  . Smokeless tobacco: Never Used  Vaping Use  . Vaping Use: Never used  Substance and Sexual Activity  . Alcohol use: Not Currently  . Drug  use: Not on file  . Sexual activity: Not on file  Other Topics Concern  . Not on file  Social History Narrative  . Not on file   Social Determinants of Health   Financial Resource Strain: Not on file  Food Insecurity: Not on file  Transportation Needs: Not on file  Physical Activity: Not on file  Stress: Not on file  Social Connections: Not on file     Family History: Mother had MI in 15s, Father had MI in 38s.  ROS:   Please see the history of present illness.    (+) Fatigue All other systems reviewed and are  negative.  EKGs/Labs/Other Studies Reviewed:    The following studies were reviewed today:   EKG:   09/14/2020: Atrial-sensed ventricular-paced rhythm, PVCs, rate 84 bpm 06/16/2019: normal sinus rhythm, rate 74, first-degree AV block, right bundle branch block, left anterior fascicular block  Echo 09/07/2020: 1. Left ventricular ejection fraction, by estimation, is 50 to 55%. The  left ventricle has low normal function. The left ventricle has no regional  wall motion abnormalities. Left ventricular diastolic parameters are  consistent with Grade I diastolic  dysfunction (impaired relaxation).  2. Right ventricular systolic function is normal. The right ventricular  size is mildly enlarged. There is normal pulmonary artery systolic  pressure.  3. Right atrial size was severely dilated.  4. The mitral valve is grossly normal. Trivial mitral valve  regurgitation.  5. The aortic valve is tricuspid. Aortic valve regurgitation trivial to  mild.  6. Aortic dilatation noted. There is borderline dilatation of the aortic  root, measuring 38 mm. There is mild dilatation of the ascending aorta,  measuring 39 mm.  7. The inferior vena cava is normal in size with greater than 50%  respiratory variability, suggesting right atrial pressure of 3 mmHg.   Comparison(s): Changes from prior study are noted. 07/01/2019: LVEF 55-60%.   Pacemaker Implant 08/18/2019: CONCLUSIONS:   1. Successful implantation of a medtronic dual-chamber pacemaker for symptomatic bradycardia due to mobitz 2, second degree AV block and 2:1 AV block with RBBB.  2. No early apparent complications.   Echo 07/01/2019: 1. Left ventricular ejection fraction, by estimation, is 55 to 60%. The  left ventricle has normal function. The left ventricle has no regional  wall motion abnormalities. There is mild left ventricular hypertrophy.  Left ventricular diastolic parameters  were normal.  2. Right ventricle is not  well-visualized. Right ventricular systolic  function is grossly normal. The right ventricular size is moderately  enlarged. There is normal pulmonary artery systolic pressure.  3. Pulmonic valve regurgitation is mild to moderate.  4. The mitral valve is normal in structure. No evidence of mitral valve  regurgitation.  5. The aortic valve is tricuspid. Aortic valve regurgitation is mild. No  aortic stenosis is present.  6. Aortic dilatation noted. There is mild dilatation of the ascending  aorta measuring 39 mm.  7. The inferior vena cava is normal in size with greater than 50%  respiratory variability, suggesting right atrial pressure of 3 mmHg.  TTE 03/23/18: - Left ventricle: The cavity size was normal. Wall thickness was  increased in a pattern of mild LVH. Systolic function was normal.  The estimated ejection fraction was in the range of 60% to 65%.  Wall motion was normal; there were no regional wall motion  abnormalities. Left ventricular diastolic function parameters  were normal.  - Aortic valve: There was trivial regurgitation.  - Aortic root: The aortic  root was mildly dilated.  - Ascending aorta: The ascending aorta was mildly dilated.  - Mitral valve: Valve area by pressure half-time: 2.24 cm^2.  - Right ventricle: The cavity size was mildly dilated.  - Atrial septum: No defect or patent foramen ovale was identified.  - Tricuspid valve: There was mild regurgitation.  - Pulmonic valve: There was mild regurgitation.  - Pulmonary arteries: PA peak pressure: 37 mm Hg (S).  - Pericardium, extracardiac: A trivial pericardial effusion was  identified posterior to the heart.   Recent Labs: No results found for requested labs within last 8760 hours.  Recent Lipid Panel No results found for: CHOL, TRIG, HDL, CHOLHDL, VLDL, LDLCALC, LDLDIRECT  Physical Exam:    VS:  BP 118/78   Pulse 84   Ht 5\' 11"  (1.803 m)   Wt 164 lb (74.4 kg)   SpO2 94%   BMI 22.87  kg/m     Wt Readings from Last 3 Encounters:  09/14/20 164 lb (74.4 kg)  11/25/19 164 lb 9.6 oz (74.7 kg)  08/18/19 160 lb (72.6 kg)     GEN:  in no acute distress HEENT: Normal NECK: No JVD CARDIAC: RRR, normal rate, no murmur RESPIRATORY:  Clear to auscultation without rales, wheezing or rhonchi  ABDOMEN: Soft, non-tender, non-distended MUSCULOSKELETAL:  trace edema SKIN: Warm and dry NEUROLOGIC:  Alert and oriented x 3 PSYCHIATRIC:  Normal affect   ASSESSMENT:    1. Bradycardia   2. AV block, Mobitz II   3. Pacemaker   4. Fatigue, unspecified type    PLAN:    Symptomatic bradycardia: Status post PPM 08/2019.  Follows with Dr. [name] in EP  Fatigue: He appears euvolemic on exam today.    Echocardiogram shows no structural heart disease  RTC in 1 year    Medication Adjustments/Labs and Tests Ordered: Current medicines are reviewed at length with the patient today.  Concerns regarding medicines are outlined above.  Orders Placed This Encounter  Procedures  . EKG 12-Lead   No orders of the defined types were placed in this encounter.   Patient Instructions  Medication Instructions:  Your physician recommends that you continue on your current medications as directed. Please refer to the Current Medication list given to you today.  *If you need a refill on your cardiac medications before your next appointment, please call your pharmacy*  Follow-Up: At [facility], you and your health needs are our priority.  As part of our continuing mission to provide you with exceptional heart care, we have created designated Provider Care Teams.  These Care Teams include your primary Cardiologist (physician) and Advanced Practice Providers (APPs -  Physician Assistants and Nurse Practitioners) who all work together to provide you with the care you need, when you need it.  We recommend signing up for the patient portal called "MyChart".  Sign up information is provided on this  After Visit Summary.  MyChart is used to connect with patients for Virtual Visits (Telemedicine).  Patients are able to view lab/test results, encounter notes, upcoming appointments, etc.  Non-urgent messages can be sent to your provider as well.   To learn more about what you can do with MyChart, go to [email or web address].    Your next appointment:   12 month(s)  The format for your next appointment:   In Person  Provider:   Oswaldo Milian, MD         [facility],acting as a scribe for Donato Heinz, MD.,have documented all relevant  documentation on the behalf of Donato Heinz, MD,as directed by  Donato Heinz, MD while in the presence of Donato Heinz, MD.  I, Donato Heinz, MD, have reviewed all documentation for this visit. The documentation on 09/14/20 for the exam, diagnosis, procedures, and orders are all accurate and complete.   Signed, Donato Heinz, MD  09/14/2020 2:55 PM    [place]

## 2020-09-14 NOTE — Patient Instructions (Signed)

## 2020-09-14 NOTE — Progress Notes (Deleted)
Cardiology Office Note:    Date:  09/14/2020   ID:  Steven Mullen, DOB December 31, 1928, MRN [id number]  PCP:  Antony Contras, MD  Cardiologist:  None  Electrophysiologist:  None   Referring MD: Antony Contras, MD   No chief complaint on file.   History of Present Illness:    Steven Mullen is a 87 y.o. male with a hx of complete heart block status post PPM, BPH, hyperlipidemia, dementia who presents for follow-up.  He was referred by Dr. Moreen Fowler for evaluation of lower extremity edema, initially seen on 06/16/2019.  He had an admission to [facility] from 03/22/2018 through 03/25/2018 with syncope.  TTE showed normal LV systolic function.  Etiology of syncope was thought to be dehydration, vasovagal.  TTE was done on 07/01/2019, which showed EF 55 to 10%, normal diastolic function, moderately enlarged RV, mild to moderate pulmonic regurgitation, mild aortic regurgitation, mild ascending aorta dilatation measuring 39 mm.  Subsequently found to have complete heart block and underwent PPM insertion 08/2019.   Past Medical History:  Diagnosis Date  . BPH (benign prostatic hyperplasia)   . History of knee replacement   . Hyperlipidemia   . Osteoporosis   . Quadriceps weakness     Past Surgical History:  Procedure Laterality Date  . PACEMAKER IMPLANT N/A 08/18/2019   Procedure: PACEMAKER IMPLANT;  Surgeon: Evans Lance, MD;  Location: [facility];  Service: Cardiovascular;  Laterality: N/A;  . REPLACEMENT TOTAL KNEE BILATERAL      Current Medications: No outpatient medications have been marked as taking for the 09/14/20 encounter (Appointment) with Donato Heinz, MD.     Allergies:   Patient has no known allergies.   Social History   Socioeconomic History  . Marital status: Widowed    Spouse name: Not on file  . Number of children: Not on file  . Years of education: Not on file  . Highest education level: Not on file  Occupational History  . Not on file  Tobacco Use  .  Smoking status: Never Smoker  . Smokeless tobacco: Never Used  Vaping Use  . Vaping Use: Never used  Substance and Sexual Activity  . Alcohol use: Not Currently  . Drug use: Not on file  . Sexual activity: Not on file  Other Topics Concern  . Not on file  Social History Narrative  . Not on file   Social Determinants of Health   Financial Resource Strain: Not on file  Food Insecurity: Not on file  Transportation Needs: Not on file  Physical Activity: Not on file  Stress: Not on file  Social Connections: Not on file     Family History: Mother had MI in 65s, Father had MI in 9s.  ROS:   Please see the history of present illness.     All other systems reviewed and are negative.  EKGs/Labs/Other Studies Reviewed:    The following studies were reviewed today:   EKG:  EKG is  ordered today.  The ekg ordered today demonstrates normal sinus rhythm, rate 74, first-degree AV block, right bundle branch block, left anterior fascicular block  TTE 03/23/18: - Left ventricle: The cavity size was normal. Wall thickness was  increased in a pattern of mild LVH. Systolic function was normal.  The estimated ejection fraction was in the range of 60% to 65%.  Wall motion was normal; there were no regional wall motion  abnormalities. Left ventricular diastolic function parameters  were normal.  - Aortic valve:  There was trivial regurgitation.  - Aortic root: The aortic root was mildly dilated.  - Ascending aorta: The ascending aorta was mildly dilated.  - Mitral valve: Valve area by pressure half-time: 2.24 cm^2.  - Right ventricle: The cavity size was mildly dilated.  - Atrial septum: No defect or patent foramen ovale was identified.  - Tricuspid valve: There was mild regurgitation.  - Pulmonic valve: There was mild regurgitation.  - Pulmonary arteries: PA peak pressure: 37 mm Hg (S).  - Pericardium, extracardiac: A trivial pericardial effusion was  identified posterior to  the heart.   Recent Labs: No results found for requested labs within last 8760 hours.  Recent Lipid Panel No results found for: CHOL, TRIG, HDL, CHOLHDL, VLDL, LDLCALC, LDLDIRECT  Physical Exam:    VS:  There were no vitals taken for this visit.    Wt Readings from Last 3 Encounters:  11/25/19 164 lb 9.6 oz (74.7 kg)  08/18/19 160 lb (72.6 kg)  08/15/19 168 lb (76.2 kg)     GEN:  in no acute distress HEENT: Normal NECK: No JVD CARDIAC: irregular, normal rate, 2/6 systolic murmur RESPIRATORY:  Clear to auscultation without rales, wheezing or rhonchi  ABDOMEN: Soft, non-tender, non-distended MUSCULOSKELETAL:  trace edema SKIN: Warm and dry NEUROLOGIC:  Alert and oriented x 3 PSYCHIATRIC:  Normal affect   ASSESSMENT:    No diagnosis found. PLAN:    Complete heart block: Status post PPM 08/2019.  Follows with Dr. [name] in EP  Lower extremity edema/shortness of breath/fatigue/weight gain: Unclear etiology.  He appears euvolemic on exam today.  Will check TTE to evaluate for structural heart disease  RTC in***   Medication Adjustments/Labs and Tests Ordered: Current medicines are reviewed at length with the patient today.  Concerns regarding medicines are outlined above.  No orders of the defined types were placed in this encounter.  No orders of the defined types were placed in this encounter.   There are no Patient Instructions on file for this visit.   Signed, Donato Heinz, MD  09/14/2020 6:56 AM    [place]

## 2020-10-19 DIAGNOSIS — R110 Nausea: Secondary | ICD-10-CM | POA: Diagnosis not present

## 2020-10-19 DIAGNOSIS — F325 Major depressive disorder, single episode, in full remission: Secondary | ICD-10-CM | POA: Diagnosis not present

## 2020-10-19 DIAGNOSIS — Z87898 Personal history of other specified conditions: Secondary | ICD-10-CM | POA: Diagnosis not present

## 2020-10-19 DIAGNOSIS — Z23 Encounter for immunization: Secondary | ICD-10-CM | POA: Diagnosis not present

## 2020-10-19 DIAGNOSIS — N390 Urinary tract infection, site not specified: Secondary | ICD-10-CM | POA: Diagnosis not present

## 2020-10-19 DIAGNOSIS — G3184 Mild cognitive impairment, so stated: Secondary | ICD-10-CM | POA: Diagnosis not present

## 2020-10-19 DIAGNOSIS — E559 Vitamin D deficiency, unspecified: Secondary | ICD-10-CM | POA: Diagnosis not present

## 2020-10-19 DIAGNOSIS — M545 Low back pain, unspecified: Secondary | ICD-10-CM | POA: Diagnosis not present

## 2020-10-19 DIAGNOSIS — R399 Unspecified symptoms and signs involving the genitourinary system: Secondary | ICD-10-CM | POA: Diagnosis not present

## 2020-10-19 DIAGNOSIS — I77819 Aortic ectasia, unspecified site: Secondary | ICD-10-CM | POA: Diagnosis not present

## 2020-10-19 DIAGNOSIS — Z950 Presence of cardiac pacemaker: Secondary | ICD-10-CM | POA: Diagnosis not present

## 2020-10-27 DIAGNOSIS — L84 Corns and callosities: Secondary | ICD-10-CM | POA: Diagnosis not present

## 2020-11-06 DIAGNOSIS — R42 Dizziness and giddiness: Secondary | ICD-10-CM | POA: Diagnosis not present

## 2020-11-06 DIAGNOSIS — D7589 Other specified diseases of blood and blood-forming organs: Secondary | ICD-10-CM | POA: Diagnosis not present

## 2020-11-06 DIAGNOSIS — N3000 Acute cystitis without hematuria: Secondary | ICD-10-CM | POA: Diagnosis not present

## 2020-11-15 DIAGNOSIS — F331 Major depressive disorder, recurrent, moderate: Secondary | ICD-10-CM | POA: Diagnosis not present

## 2020-11-16 ENCOUNTER — Ambulatory Visit (INDEPENDENT_AMBULATORY_CARE_PROVIDER_SITE_OTHER): Payer: Medicare Other

## 2020-11-16 DIAGNOSIS — I441 Atrioventricular block, second degree: Secondary | ICD-10-CM | POA: Diagnosis not present

## 2020-11-16 LAB — CUP PACEART REMOTE DEVICE CHECK
Battery Remaining Longevity: 104 mo
Battery Voltage: 3.02 V
Brady Statistic AP VP Percent: 25.91 %
Brady Statistic AP VS Percent: 0.01 %
Brady Statistic AS VP Percent: 66.29 %
Brady Statistic AS VS Percent: 7.79 %
Brady Statistic RA Percent Paced: 29.93 %
Brady Statistic RV Percent Paced: 92.2 %
Date Time Interrogation Session: 20220809034810
Implantable Lead Implant Date: 20210510
Implantable Lead Implant Date: 20210510
Implantable Lead Location: 753859
Implantable Lead Location: 753860
Implantable Lead Model: 3830
Implantable Lead Model: 5076
Implantable Pulse Generator Implant Date: 20210510
Lead Channel Impedance Value: 323 Ohm
Lead Channel Impedance Value: 342 Ohm
Lead Channel Impedance Value: 437 Ohm
Lead Channel Impedance Value: 475 Ohm
Lead Channel Pacing Threshold Amplitude: 0.75 V
Lead Channel Pacing Threshold Amplitude: 1.625 V
Lead Channel Pacing Threshold Pulse Width: 0.4 ms
Lead Channel Pacing Threshold Pulse Width: 0.4 ms
Lead Channel Sensing Intrinsic Amplitude: 1.75 mV
Lead Channel Sensing Intrinsic Amplitude: 1.75 mV
Lead Channel Sensing Intrinsic Amplitude: 6 mV
Lead Channel Sensing Intrinsic Amplitude: 6 mV
Lead Channel Setting Pacing Amplitude: 1.75 V
Lead Channel Setting Pacing Amplitude: 3.25 V
Lead Channel Setting Pacing Pulse Width: 0.4 ms
Lead Channel Setting Sensing Sensitivity: 1.2 mV

## 2020-12-09 DIAGNOSIS — R350 Frequency of micturition: Secondary | ICD-10-CM | POA: Diagnosis not present

## 2020-12-09 DIAGNOSIS — N3020 Other chronic cystitis without hematuria: Secondary | ICD-10-CM | POA: Diagnosis not present

## 2020-12-09 DIAGNOSIS — N401 Enlarged prostate with lower urinary tract symptoms: Secondary | ICD-10-CM | POA: Diagnosis not present

## 2020-12-09 DIAGNOSIS — R351 Nocturia: Secondary | ICD-10-CM | POA: Diagnosis not present

## 2020-12-09 DIAGNOSIS — R3915 Urgency of urination: Secondary | ICD-10-CM | POA: Diagnosis not present

## 2020-12-09 DIAGNOSIS — R3912 Poor urinary stream: Secondary | ICD-10-CM | POA: Diagnosis not present

## 2020-12-09 NOTE — Progress Notes (Signed)
Remote pacemaker transmission.   

## 2021-01-20 DIAGNOSIS — N401 Enlarged prostate with lower urinary tract symptoms: Secondary | ICD-10-CM | POA: Diagnosis not present

## 2021-01-20 DIAGNOSIS — R42 Dizziness and giddiness: Secondary | ICD-10-CM | POA: Diagnosis not present

## 2021-01-24 DIAGNOSIS — F331 Major depressive disorder, recurrent, moderate: Secondary | ICD-10-CM | POA: Diagnosis not present

## 2021-02-03 DIAGNOSIS — R3915 Urgency of urination: Secondary | ICD-10-CM | POA: Diagnosis not present

## 2021-02-03 DIAGNOSIS — R310 Gross hematuria: Secondary | ICD-10-CM | POA: Diagnosis not present

## 2021-02-03 DIAGNOSIS — R42 Dizziness and giddiness: Secondary | ICD-10-CM | POA: Diagnosis not present

## 2021-02-03 DIAGNOSIS — Z9181 History of falling: Secondary | ICD-10-CM | POA: Diagnosis not present

## 2021-02-03 DIAGNOSIS — N401 Enlarged prostate with lower urinary tract symptoms: Secondary | ICD-10-CM | POA: Diagnosis not present

## 2021-02-08 DIAGNOSIS — N401 Enlarged prostate with lower urinary tract symptoms: Secondary | ICD-10-CM | POA: Diagnosis not present

## 2021-02-08 DIAGNOSIS — Z9181 History of falling: Secondary | ICD-10-CM | POA: Diagnosis not present

## 2021-02-08 DIAGNOSIS — R3915 Urgency of urination: Secondary | ICD-10-CM | POA: Diagnosis not present

## 2021-02-08 DIAGNOSIS — R42 Dizziness and giddiness: Secondary | ICD-10-CM | POA: Diagnosis not present

## 2021-02-09 DIAGNOSIS — Z23 Encounter for immunization: Secondary | ICD-10-CM | POA: Diagnosis not present

## 2021-02-10 DIAGNOSIS — Z9181 History of falling: Secondary | ICD-10-CM | POA: Diagnosis not present

## 2021-02-10 DIAGNOSIS — R42 Dizziness and giddiness: Secondary | ICD-10-CM | POA: Diagnosis not present

## 2021-02-10 DIAGNOSIS — R3915 Urgency of urination: Secondary | ICD-10-CM | POA: Diagnosis not present

## 2021-02-10 DIAGNOSIS — N401 Enlarged prostate with lower urinary tract symptoms: Secondary | ICD-10-CM | POA: Diagnosis not present

## 2021-02-14 DIAGNOSIS — R3915 Urgency of urination: Secondary | ICD-10-CM | POA: Diagnosis not present

## 2021-02-14 DIAGNOSIS — Z9181 History of falling: Secondary | ICD-10-CM | POA: Diagnosis not present

## 2021-02-14 DIAGNOSIS — N401 Enlarged prostate with lower urinary tract symptoms: Secondary | ICD-10-CM | POA: Diagnosis not present

## 2021-02-14 DIAGNOSIS — R42 Dizziness and giddiness: Secondary | ICD-10-CM | POA: Diagnosis not present

## 2021-02-15 ENCOUNTER — Ambulatory Visit (INDEPENDENT_AMBULATORY_CARE_PROVIDER_SITE_OTHER): Payer: Medicare Other

## 2021-02-15 DIAGNOSIS — I441 Atrioventricular block, second degree: Secondary | ICD-10-CM

## 2021-02-15 LAB — CUP PACEART REMOTE DEVICE CHECK
Battery Remaining Longevity: 94 mo
Battery Voltage: 3.01 V
Brady Statistic AP VP Percent: 27.41 %
Brady Statistic AP VS Percent: 0 %
Brady Statistic AS VP Percent: 72.24 %
Brady Statistic AS VS Percent: 0.35 %
Brady Statistic RA Percent Paced: 27.38 %
Brady Statistic RV Percent Paced: 99.65 %
Date Time Interrogation Session: 20221107210135
Implantable Lead Implant Date: 20210510
Implantable Lead Implant Date: 20210510
Implantable Lead Location: 753859
Implantable Lead Location: 753860
Implantable Lead Model: 3830
Implantable Lead Model: 5076
Implantable Pulse Generator Implant Date: 20210510
Lead Channel Impedance Value: 323 Ohm
Lead Channel Impedance Value: 342 Ohm
Lead Channel Impedance Value: 456 Ohm
Lead Channel Impedance Value: 570 Ohm
Lead Channel Pacing Threshold Amplitude: 1 V
Lead Channel Pacing Threshold Amplitude: 1.75 V
Lead Channel Pacing Threshold Pulse Width: 0.4 ms
Lead Channel Pacing Threshold Pulse Width: 0.4 ms
Lead Channel Sensing Intrinsic Amplitude: 1.375 mV
Lead Channel Sensing Intrinsic Amplitude: 1.375 mV
Lead Channel Sensing Intrinsic Amplitude: 5.75 mV
Lead Channel Sensing Intrinsic Amplitude: 5.75 mV
Lead Channel Setting Pacing Amplitude: 2 V
Lead Channel Setting Pacing Amplitude: 3.5 V
Lead Channel Setting Pacing Pulse Width: 0.4 ms
Lead Channel Setting Sensing Sensitivity: 1.2 mV

## 2021-02-16 DIAGNOSIS — R42 Dizziness and giddiness: Secondary | ICD-10-CM | POA: Diagnosis not present

## 2021-02-16 DIAGNOSIS — N401 Enlarged prostate with lower urinary tract symptoms: Secondary | ICD-10-CM | POA: Diagnosis not present

## 2021-02-16 DIAGNOSIS — Z9181 History of falling: Secondary | ICD-10-CM | POA: Diagnosis not present

## 2021-02-16 DIAGNOSIS — R3915 Urgency of urination: Secondary | ICD-10-CM | POA: Diagnosis not present

## 2021-02-21 DIAGNOSIS — N401 Enlarged prostate with lower urinary tract symptoms: Secondary | ICD-10-CM | POA: Diagnosis not present

## 2021-02-21 DIAGNOSIS — R42 Dizziness and giddiness: Secondary | ICD-10-CM | POA: Diagnosis not present

## 2021-02-21 DIAGNOSIS — R3915 Urgency of urination: Secondary | ICD-10-CM | POA: Diagnosis not present

## 2021-02-21 DIAGNOSIS — Z9181 History of falling: Secondary | ICD-10-CM | POA: Diagnosis not present

## 2021-02-23 NOTE — Progress Notes (Signed)
Remote pacemaker transmission.   

## 2021-02-24 DIAGNOSIS — Z9181 History of falling: Secondary | ICD-10-CM | POA: Diagnosis not present

## 2021-02-24 DIAGNOSIS — R42 Dizziness and giddiness: Secondary | ICD-10-CM | POA: Diagnosis not present

## 2021-02-24 DIAGNOSIS — R3915 Urgency of urination: Secondary | ICD-10-CM | POA: Diagnosis not present

## 2021-02-24 DIAGNOSIS — N401 Enlarged prostate with lower urinary tract symptoms: Secondary | ICD-10-CM | POA: Diagnosis not present

## 2021-02-28 DIAGNOSIS — R3915 Urgency of urination: Secondary | ICD-10-CM | POA: Diagnosis not present

## 2021-02-28 DIAGNOSIS — Z9181 History of falling: Secondary | ICD-10-CM | POA: Diagnosis not present

## 2021-02-28 DIAGNOSIS — N401 Enlarged prostate with lower urinary tract symptoms: Secondary | ICD-10-CM | POA: Diagnosis not present

## 2021-02-28 DIAGNOSIS — R42 Dizziness and giddiness: Secondary | ICD-10-CM | POA: Diagnosis not present

## 2021-03-02 DIAGNOSIS — R42 Dizziness and giddiness: Secondary | ICD-10-CM | POA: Diagnosis not present

## 2021-03-02 DIAGNOSIS — R3915 Urgency of urination: Secondary | ICD-10-CM | POA: Diagnosis not present

## 2021-03-02 DIAGNOSIS — N401 Enlarged prostate with lower urinary tract symptoms: Secondary | ICD-10-CM | POA: Diagnosis not present

## 2021-03-02 DIAGNOSIS — Z9181 History of falling: Secondary | ICD-10-CM | POA: Diagnosis not present

## 2021-03-05 DIAGNOSIS — Z9181 History of falling: Secondary | ICD-10-CM | POA: Diagnosis not present

## 2021-03-05 DIAGNOSIS — N401 Enlarged prostate with lower urinary tract symptoms: Secondary | ICD-10-CM | POA: Diagnosis not present

## 2021-03-05 DIAGNOSIS — R3915 Urgency of urination: Secondary | ICD-10-CM | POA: Diagnosis not present

## 2021-03-05 DIAGNOSIS — R42 Dizziness and giddiness: Secondary | ICD-10-CM | POA: Diagnosis not present

## 2021-03-09 DIAGNOSIS — R3915 Urgency of urination: Secondary | ICD-10-CM | POA: Diagnosis not present

## 2021-03-09 DIAGNOSIS — N401 Enlarged prostate with lower urinary tract symptoms: Secondary | ICD-10-CM | POA: Diagnosis not present

## 2021-03-09 DIAGNOSIS — Z9181 History of falling: Secondary | ICD-10-CM | POA: Diagnosis not present

## 2021-03-09 DIAGNOSIS — R42 Dizziness and giddiness: Secondary | ICD-10-CM | POA: Diagnosis not present

## 2021-03-16 DIAGNOSIS — R3915 Urgency of urination: Secondary | ICD-10-CM | POA: Diagnosis not present

## 2021-03-16 DIAGNOSIS — N401 Enlarged prostate with lower urinary tract symptoms: Secondary | ICD-10-CM | POA: Diagnosis not present

## 2021-03-16 DIAGNOSIS — Z9181 History of falling: Secondary | ICD-10-CM | POA: Diagnosis not present

## 2021-03-16 DIAGNOSIS — R42 Dizziness and giddiness: Secondary | ICD-10-CM | POA: Diagnosis not present

## 2021-03-23 DIAGNOSIS — R42 Dizziness and giddiness: Secondary | ICD-10-CM | POA: Diagnosis not present

## 2021-03-23 DIAGNOSIS — N401 Enlarged prostate with lower urinary tract symptoms: Secondary | ICD-10-CM | POA: Diagnosis not present

## 2021-03-23 DIAGNOSIS — Z9181 History of falling: Secondary | ICD-10-CM | POA: Diagnosis not present

## 2021-03-23 DIAGNOSIS — R3915 Urgency of urination: Secondary | ICD-10-CM | POA: Diagnosis not present

## 2021-04-01 DIAGNOSIS — Z9181 History of falling: Secondary | ICD-10-CM | POA: Diagnosis not present

## 2021-04-01 DIAGNOSIS — R3915 Urgency of urination: Secondary | ICD-10-CM | POA: Diagnosis not present

## 2021-04-01 DIAGNOSIS — R42 Dizziness and giddiness: Secondary | ICD-10-CM | POA: Diagnosis not present

## 2021-04-01 DIAGNOSIS — N401 Enlarged prostate with lower urinary tract symptoms: Secondary | ICD-10-CM | POA: Diagnosis not present

## 2021-04-04 DIAGNOSIS — R3915 Urgency of urination: Secondary | ICD-10-CM | POA: Diagnosis not present

## 2021-04-04 DIAGNOSIS — N401 Enlarged prostate with lower urinary tract symptoms: Secondary | ICD-10-CM | POA: Diagnosis not present

## 2021-04-04 DIAGNOSIS — Z9181 History of falling: Secondary | ICD-10-CM | POA: Diagnosis not present

## 2021-04-04 DIAGNOSIS — Z79899 Other long term (current) drug therapy: Secondary | ICD-10-CM | POA: Diagnosis not present

## 2021-04-04 DIAGNOSIS — R42 Dizziness and giddiness: Secondary | ICD-10-CM | POA: Diagnosis not present

## 2021-04-07 DIAGNOSIS — Z9181 History of falling: Secondary | ICD-10-CM | POA: Diagnosis not present

## 2021-04-07 DIAGNOSIS — Z79899 Other long term (current) drug therapy: Secondary | ICD-10-CM | POA: Diagnosis not present

## 2021-04-07 DIAGNOSIS — R42 Dizziness and giddiness: Secondary | ICD-10-CM | POA: Diagnosis not present

## 2021-04-07 DIAGNOSIS — R3915 Urgency of urination: Secondary | ICD-10-CM | POA: Diagnosis not present

## 2021-04-07 DIAGNOSIS — N401 Enlarged prostate with lower urinary tract symptoms: Secondary | ICD-10-CM | POA: Diagnosis not present

## 2021-04-12 ENCOUNTER — Telehealth: Payer: Self-pay

## 2021-04-12 NOTE — Telephone Encounter (Signed)
Attempted to contact patient's daughter Jeannene Patella to schedule a Palliative Care consult appointment. No answer left a message to return call.

## 2021-04-14 DIAGNOSIS — R42 Dizziness and giddiness: Secondary | ICD-10-CM | POA: Diagnosis not present

## 2021-04-14 DIAGNOSIS — Z9181 History of falling: Secondary | ICD-10-CM | POA: Diagnosis not present

## 2021-04-14 DIAGNOSIS — R3915 Urgency of urination: Secondary | ICD-10-CM | POA: Diagnosis not present

## 2021-04-14 DIAGNOSIS — N401 Enlarged prostate with lower urinary tract symptoms: Secondary | ICD-10-CM | POA: Diagnosis not present

## 2021-04-14 DIAGNOSIS — Z79899 Other long term (current) drug therapy: Secondary | ICD-10-CM | POA: Diagnosis not present

## 2021-04-20 ENCOUNTER — Telehealth: Payer: Self-pay

## 2021-04-20 NOTE — Telephone Encounter (Signed)
Spoke with patient's daughter Steven Mullen and scheduled an in-person Palliative Consult for 04/29/21 @ 11AM.  COVID screening was negative. Dog in the home will put away. Patient is living with his daughter.   Consent obtained; updated Outlook/Netsmart/Team List and Epic.   Family is aware they may be receiving a call from provider the day before or day of to confirm appointment.

## 2021-04-21 DIAGNOSIS — R42 Dizziness and giddiness: Secondary | ICD-10-CM | POA: Diagnosis not present

## 2021-04-21 DIAGNOSIS — Z9181 History of falling: Secondary | ICD-10-CM | POA: Diagnosis not present

## 2021-04-21 DIAGNOSIS — Z79899 Other long term (current) drug therapy: Secondary | ICD-10-CM | POA: Diagnosis not present

## 2021-04-21 DIAGNOSIS — N401 Enlarged prostate with lower urinary tract symptoms: Secondary | ICD-10-CM | POA: Diagnosis not present

## 2021-04-21 DIAGNOSIS — R3915 Urgency of urination: Secondary | ICD-10-CM | POA: Diagnosis not present

## 2021-04-25 DIAGNOSIS — N401 Enlarged prostate with lower urinary tract symptoms: Secondary | ICD-10-CM | POA: Diagnosis not present

## 2021-04-25 DIAGNOSIS — Z9181 History of falling: Secondary | ICD-10-CM | POA: Diagnosis not present

## 2021-04-25 DIAGNOSIS — R42 Dizziness and giddiness: Secondary | ICD-10-CM | POA: Diagnosis not present

## 2021-04-25 DIAGNOSIS — Z79899 Other long term (current) drug therapy: Secondary | ICD-10-CM | POA: Diagnosis not present

## 2021-04-25 DIAGNOSIS — R3915 Urgency of urination: Secondary | ICD-10-CM | POA: Diagnosis not present

## 2021-04-29 ENCOUNTER — Other Ambulatory Visit: Payer: Self-pay

## 2021-04-29 ENCOUNTER — Other Ambulatory Visit: Payer: Medicare Other | Admitting: Family Medicine

## 2021-04-29 VITALS — BP 138/76 | HR 72 | Temp 97.6°F | Resp 16

## 2021-04-29 DIAGNOSIS — F03A3 Unspecified dementia, mild, with mood disturbance: Secondary | ICD-10-CM | POA: Diagnosis not present

## 2021-04-29 DIAGNOSIS — H8110 Benign paroxysmal vertigo, unspecified ear: Secondary | ICD-10-CM

## 2021-04-29 DIAGNOSIS — Z515 Encounter for palliative care: Secondary | ICD-10-CM | POA: Diagnosis not present

## 2021-04-29 NOTE — Progress Notes (Signed)
Therapist, nutritional Palliative Care Consult Note Telephone: [phone]  Fax: [phone]   Date of encounter: 04/29/2021 11:40 AM PATIENT NAME: Steven Mullen [address] [place] [address]   [phone] (home)  DOB: [birth year] MRN: [id number] PRIMARY CARE PROVIDER:    Tally Joe, MD,  [address] [place] [address] [phone]  REFERRING PROVIDER:   Tally Joe, MD [address] [place],  [place] [address] [phone]  RESPONSIBLE PARTY:    Contact Information     Name Relation Home Work [place] Daughter [phone]  [phone]   Magdalen Spatz Relative   [phone]   Aqu[place], Delaughter   [phone]   [place], [place]   [phone]        I met face to face with patient, daughter Elita Quick and son Tammy Sours in daughter's  home. Palliative Care was asked to follow this patient by consultation request of  Tally Joe, MD to address advance care planning and complex medical decision making. This is the initial visit.          ASSESSMENT, SYMPTOM MANAGEMENT AND PLAN / RECOMMENDATIONS:   Palliative Care Encounter- Living with daughter and son-in-law, has           Private 24 hour caregivers.  Daughter and 2 sons are [facility] POA. Has        DNR and MOST. Has walker, transfer chair, bedside commode.   BPPV-has home PT with [facility] to provide vestibular            Therapy and strengthening. Recommend Zofran ODT 4 mg Q 8hrs prn        For nausea.   Mild Dementia with Mood Disturbance-Is under care of Dr Corrin Parker, continue Aricept 5 mg daily and Wellbutrin XL150 mg daily.    Follow up Palliative Care Visit: Palliative care will continue to follow for complex medical decision making, advance care planning, and clarification of goals. Return 8 weeks or prn.    This visit was coded based on medical decision making (MDM).  PPS: 50%  HOSPICE ELIGIBILITY/DIAGNOSIS: TBD  Chief  Complaint:  [facility] received referral to follow up with pt and family for chronic care medical management and goals of care.  HISTORY OF PRESENT ILLNESS:  Steven Mullen is a 88 y.o. 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  He suddenly developed severe vertigo in August/September of 2022 with severe dizziness, near falls and nausea.  Dizziness was so severe that initially pt was bedbound for almost 1 month so he has become more weak and deconditioned.  Has a grandson (daughter' son) who is in medical school at [employer].  Family pays out of pocket for nighttime caregiver to help prevent falls. Pt has mild dementia  for which he is on Aricept and some longstanding hx of depression for which he is followed by psychiatrist Dr Corrin Parker and well managed on Wellbutrin XL 150 mg daily.   He requires some assistance with bathing and dressing and standby assist at present for safe transfers for toileting.  His appetite varies but overall is pretty stable and he does do some supplements to maintain.  Initially nausea was severe with vertigo but now is more rare. Family does work with him to do vestibular exercises in between PT.  He is mostly continent of bowel and bladder and has bedside commode next to his bed.  History obtained  from review of EMR, discussion with family, and Dr Ahmed Prima.  I reviewed available labs, medications, imaging, studies and related documents from the EMR.  Records reviewed and summarized above.   ROS General: NAD EYES: denies vision changes ENMT: denies dysphagia Cardiovascular: denies chest pain, denies DOE Pulmonary: denies cough, denies increased SOB Abdomen: endorses good appetite, some constipation, endorses continence of bowel GU: denies dysuria, endorses continence of urine MSK:  Endorses increased weakness, no falls reported Skin: denies rashes or wounds Neurological: denies insomnia Psych: Endorses positive  mood Heme/lymph/immuno: denies bruises, abnormal bleeding  Physical Exam: Last weight: 164 lbs (74.4 kg) as of 09/14/20 Constitutional: NAD General: WNWD EYES: anicteric sclera, lids intact, no discharge  ENMT: intact hearing, oral mucous membranes moist, dentition intact CV: S1S2, RRR, trace BLE edema Pulmonary: CTAB, no increased work of breathing, no cough, room air Abdomen: normo-active BS + 4 quadrants, soft and non tender, no ascites GU: deferred MSK: no sarcopenia, moves all extremities, ambulatory with SBA and rolling walker Skin: warm and dry, no rashes or wounds on visible skin Neuro:  noted generalized weakness, some short term memory loss Psych: non-anxious affect, A and O x 3 Hem/lymph/immuno: no widespread bruising  CURRENT PROBLEM LIST:  Patient Active Problem List   Diagnosis Date Noted   Pacemaker 11/25/2019   Edema 08/15/2019   Dementia ([place]) 08/15/2019   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/15/2019   Bradycardia 08/15/2019   AV block, Mobitz II 08/15/2019   History of syncope 03/22/2018   Acute renal injury ([place]) 03/22/2018   Dehydration 03/22/2018   History of total knee replacement, bilateral 07/10/2012   Osteoporosis 01/13/2009   Hypertrophy of prostate without urinary obstruction and other lower urinary tract symptoms (LUTS) 11/26/2008   Other and unspecified hyperlipidemia 04/08/2007   PAST MEDICAL HISTORY:  Active Ambulatory Problems    Diagnosis Date Noted   History of syncope 03/22/2018   Hypertrophy of prostate without urinary obstruction and other lower urinary tract symptoms (LUTS) 11/26/2008   History of total knee replacement, bilateral 07/10/2012   Osteoporosis 01/13/2009   Other and unspecified hyperlipidemia 04/08/2007   Acute renal injury ([place]) 03/22/2018   Dehydration 03/22/2018   Edema 08/15/2019   Dementia ([place]) 08/15/2019   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/15/2019   Bradycardia 08/15/2019   AV block, Mobitz II  08/15/2019   Pacemaker 11/25/2019   Resolved Ambulatory Problems    Diagnosis Date Noted   No Resolved Ambulatory Problems   Past Medical History:  Diagnosis Date   BPH (benign prostatic hyperplasia)    History of knee replacement    Hyperlipidemia    Quadriceps weakness    SOCIAL HX:  Social History   Tobacco Use   Smoking status: Never   Smokeless tobacco: Never  Substance Use Topics   Alcohol use: Not Currently   FAMILY HX: History reviewed. No pertinent family history.     Preferred Pharmacy: ALLERGIES: No Known Allergies   PERTINENT MEDICATIONS:  Outpatient Encounter Medications as of 04/29/2021  Medication Sig   acetaminophen (TYLENOL) 325 MG tablet Take 325 mg by mouth 2 (two) times daily as needed for moderate pain or headache.   buPROPion (WELLBUTRIN XL) 150 MG 24 hr tablet Take 150 mg by mouth every morning.   Cholecalciferol (VITAMIN D) 50 MCG (2000 UT) tablet Take 2,000 Units by mouth daily.   donepezil (ARICEPT) 5 MG tablet Take 5 mg by mouth at bedtime.    Multiple Vitamins-Minerals (CENTRUM SILVER) tablet 1  tablet   ondansetron (ZOFRAN) 4 MG tablet Take 4 mg by mouth every 8 (eight) hours as needed for nausea or vomiting.   No facility-administered encounter medications on file as of 04/29/2021.     ---------------------------------------------------------------------------------------------------------------------------------------------------------------------------------------------------------------- Advance Care Planning/Goals of Care: Goals include to maximize quality of life and symptom management. Patient/health care surrogate gave their permission to discuss.Our advance care planning conversation included a discussion about:    The value and importance of advance care planning-has HC POA, LIVING will, DNR and MOST Exploration of personal, cultural or spiritual beliefs that might influence medical decisions  Exploration of goals of care in the event  of a sudden injury or illness-DNR and MOST Identification  of a healthcare agent-daughter Jeannene Patella is first [facility] POA Review of an advance directive document-MOST. Decision not to resuscitate or to de-escalate disease focused treatments due to poor prognosis. CODE STATUS: DNR  MOST as of 10/31/2019: DNI/Comfort Measures IV fluids if indicated Antibiotics on case by case basis No feeding tube  I spent 5-7 minutes providing this consultation providing Palliative Care counseling on goals of care.    Thank you for the opportunity to participate in the care of Mr. Quirk.  The palliative care team will continue to follow. Please call our office at [phone] if we can be of additional assistance.   Marijo Conception, FNP-C  COVID-19 PATIENT SCREENING TOOL Asked and negative response unless otherwise noted:  Have you had symptoms of covid, tested positive or been in contact with someone with symptoms/positive test in the past 5-10 days? NO

## 2021-05-01 ENCOUNTER — Encounter: Payer: Self-pay | Admitting: Family Medicine

## 2021-05-04 DIAGNOSIS — R42 Dizziness and giddiness: Secondary | ICD-10-CM | POA: Diagnosis not present

## 2021-05-04 DIAGNOSIS — N401 Enlarged prostate with lower urinary tract symptoms: Secondary | ICD-10-CM | POA: Diagnosis not present

## 2021-05-04 DIAGNOSIS — R3915 Urgency of urination: Secondary | ICD-10-CM | POA: Diagnosis not present

## 2021-05-04 DIAGNOSIS — Z79899 Other long term (current) drug therapy: Secondary | ICD-10-CM | POA: Diagnosis not present

## 2021-05-04 DIAGNOSIS — Z9181 History of falling: Secondary | ICD-10-CM | POA: Diagnosis not present

## 2021-05-06 DIAGNOSIS — Z79899 Other long term (current) drug therapy: Secondary | ICD-10-CM | POA: Diagnosis not present

## 2021-05-06 DIAGNOSIS — Z9181 History of falling: Secondary | ICD-10-CM | POA: Diagnosis not present

## 2021-05-06 DIAGNOSIS — N401 Enlarged prostate with lower urinary tract symptoms: Secondary | ICD-10-CM | POA: Diagnosis not present

## 2021-05-06 DIAGNOSIS — R3915 Urgency of urination: Secondary | ICD-10-CM | POA: Diagnosis not present

## 2021-05-06 DIAGNOSIS — R42 Dizziness and giddiness: Secondary | ICD-10-CM | POA: Diagnosis not present

## 2021-05-12 DIAGNOSIS — R42 Dizziness and giddiness: Secondary | ICD-10-CM | POA: Diagnosis not present

## 2021-05-12 DIAGNOSIS — Z79899 Other long term (current) drug therapy: Secondary | ICD-10-CM | POA: Diagnosis not present

## 2021-05-12 DIAGNOSIS — R3915 Urgency of urination: Secondary | ICD-10-CM | POA: Diagnosis not present

## 2021-05-12 DIAGNOSIS — Z9181 History of falling: Secondary | ICD-10-CM | POA: Diagnosis not present

## 2021-05-12 DIAGNOSIS — N401 Enlarged prostate with lower urinary tract symptoms: Secondary | ICD-10-CM | POA: Diagnosis not present

## 2021-05-17 ENCOUNTER — Ambulatory Visit (INDEPENDENT_AMBULATORY_CARE_PROVIDER_SITE_OTHER): Payer: Medicare Other

## 2021-05-17 DIAGNOSIS — I441 Atrioventricular block, second degree: Secondary | ICD-10-CM

## 2021-05-17 LAB — CUP PACEART REMOTE DEVICE CHECK
Battery Remaining Longevity: 69 mo
Battery Voltage: 3 V
Brady Statistic AP VP Percent: 21 %
Brady Statistic AP VS Percent: 0.01 %
Brady Statistic AS VP Percent: 78.3 %
Brady Statistic AS VS Percent: 0.69 %
Brady Statistic RA Percent Paced: 21.1 %
Brady Statistic RV Percent Paced: 99.3 %
Date Time Interrogation Session: 20230207015715
Implantable Lead Implant Date: 20210510
Implantable Lead Implant Date: 20210510
Implantable Lead Location: 753859
Implantable Lead Location: 753860
Implantable Lead Model: 3830
Implantable Lead Model: 5076
Implantable Pulse Generator Implant Date: 20210510
Lead Channel Impedance Value: 304 Ohm
Lead Channel Impedance Value: 323 Ohm
Lead Channel Impedance Value: 437 Ohm
Lead Channel Impedance Value: 475 Ohm
Lead Channel Pacing Threshold Amplitude: 0.875 V
Lead Channel Pacing Threshold Amplitude: 2.125 V
Lead Channel Pacing Threshold Pulse Width: 0.4 ms
Lead Channel Pacing Threshold Pulse Width: 0.4 ms
Lead Channel Sensing Intrinsic Amplitude: 1.875 mV
Lead Channel Sensing Intrinsic Amplitude: 1.875 mV
Lead Channel Sensing Intrinsic Amplitude: 8.5 mV
Lead Channel Sensing Intrinsic Amplitude: 8.5 mV
Lead Channel Setting Pacing Amplitude: 1.75 V
Lead Channel Setting Pacing Amplitude: 4.25 V
Lead Channel Setting Pacing Pulse Width: 0.4 ms
Lead Channel Setting Sensing Sensitivity: 1.2 mV

## 2021-05-20 DIAGNOSIS — R3915 Urgency of urination: Secondary | ICD-10-CM | POA: Diagnosis not present

## 2021-05-20 DIAGNOSIS — Z79899 Other long term (current) drug therapy: Secondary | ICD-10-CM | POA: Diagnosis not present

## 2021-05-20 DIAGNOSIS — R42 Dizziness and giddiness: Secondary | ICD-10-CM | POA: Diagnosis not present

## 2021-05-20 DIAGNOSIS — F331 Major depressive disorder, recurrent, moderate: Secondary | ICD-10-CM | POA: Diagnosis not present

## 2021-05-20 DIAGNOSIS — Z9181 History of falling: Secondary | ICD-10-CM | POA: Diagnosis not present

## 2021-05-20 DIAGNOSIS — N401 Enlarged prostate with lower urinary tract symptoms: Secondary | ICD-10-CM | POA: Diagnosis not present

## 2021-05-20 NOTE — Progress Notes (Signed)
Remote pacemaker transmission.   

## 2021-05-27 DIAGNOSIS — Z9181 History of falling: Secondary | ICD-10-CM | POA: Diagnosis not present

## 2021-05-27 DIAGNOSIS — R42 Dizziness and giddiness: Secondary | ICD-10-CM | POA: Diagnosis not present

## 2021-05-27 DIAGNOSIS — R3915 Urgency of urination: Secondary | ICD-10-CM | POA: Diagnosis not present

## 2021-05-27 DIAGNOSIS — N401 Enlarged prostate with lower urinary tract symptoms: Secondary | ICD-10-CM | POA: Diagnosis not present

## 2021-05-27 DIAGNOSIS — Z79899 Other long term (current) drug therapy: Secondary | ICD-10-CM | POA: Diagnosis not present

## 2021-06-16 ENCOUNTER — Other Ambulatory Visit: Payer: Medicare Other | Admitting: *Deleted

## 2021-06-16 ENCOUNTER — Other Ambulatory Visit: Payer: Self-pay

## 2021-06-16 DIAGNOSIS — Z515 Encounter for palliative care: Secondary | ICD-10-CM

## 2021-06-16 NOTE — Progress Notes (Signed)
[facility] PALLIATIVE CARE RN NOTE ? ?PATIENT NAME: Steven Mullen ?DOB: 08-Dec-1928 ?MRN: [id number] ? ?PRIMARY CARE PROVIDER: Antony Contras, MD ? ?RESPONSIBLE PARTY: [name] (daughter) ?Acct ID - Guarantor Home Phone Work Phone Relationship Acct Type  ?[id number] [name], [name]* [phone]  Self P/F  ?   [address] [place], [place] [address]  ? ?Due to the COVID-19 crisis, this virtual check-in visit was done via telephone from my office and it was initiated and consent by this patient and or family. ? ?RN telephonic encounter completed with patient's daughter Steven Mullen. She states that patient has had an overall decline over the past few days. His energy level and appetite has declined. This morning his sitter states he is not wanting to get up or eat/drink anything. Daughter feels that he may have another UTI. She says it usually presents as very concentrated and cloudy urine along with increased weakness and fatigue. She is requesting a urine sample be obtained and sent to the lab if possible. Advised that since patient meets homebound criteria I could get one in the morning at 10a. I made our palliative care NP aware, Damaris Hippo who gave the order for urinalysis and urine culture. Daughter is Patent attorney. ? ? ?(Duration of visit and documentation 15 minutes) ? ? ?Daryl Eastern, RN BSN ? ?

## 2021-06-17 ENCOUNTER — Other Ambulatory Visit: Payer: Self-pay

## 2021-06-17 ENCOUNTER — Other Ambulatory Visit: Payer: Medicare Other | Admitting: *Deleted

## 2021-06-17 DIAGNOSIS — N390 Urinary tract infection, site not specified: Secondary | ICD-10-CM | POA: Diagnosis not present

## 2021-06-17 DIAGNOSIS — Z515 Encounter for palliative care: Secondary | ICD-10-CM

## 2021-06-17 NOTE — Progress Notes (Signed)
[facility] PALLIATIVE CARE RN NOTE ? ?PATIENT NAME: Steven Mullen ?DOB: January 15, 1929 ?MRN: [id number] ? ?PRIMARY CARE PROVIDER: Antony Contras, MD ? ?RESPONSIBLE PARTY: [name] (daughter) ?Acct ID - Guarantor Home Phone Work Phone Relationship Acct Type  ?[id number] JUNIPER, SNYDERS* [phone]  Self P/F  ?   [address] [place], [place] [address]  ? ?Covid-19 Prescreening Negative ? ?RN visit completed with patient today to obtain a urine specimen. Daughter feels patient may have a UTI due to cloudy, concentrated urine and patient presenting with low energy and decreased appetite. Specimen obtained and dropped off at [facility] on [address] for [employer] and culture w/sensitivities. Daughter is Patent attorney. ? ? ?(Duration of visit and documentation 30 minutes) ? ? ? ?Daryl Eastern, RN BSN ? ?

## 2021-08-06 IMAGING — CR DG CHEST 2V
2 series · 2 of 2 positions shown · non-contrast
Comparison: 03/22/2018

CLINICAL DATA: Postop for pacemaker placement

EXAM:
CHEST - 2 VIEW

[w chest pa]
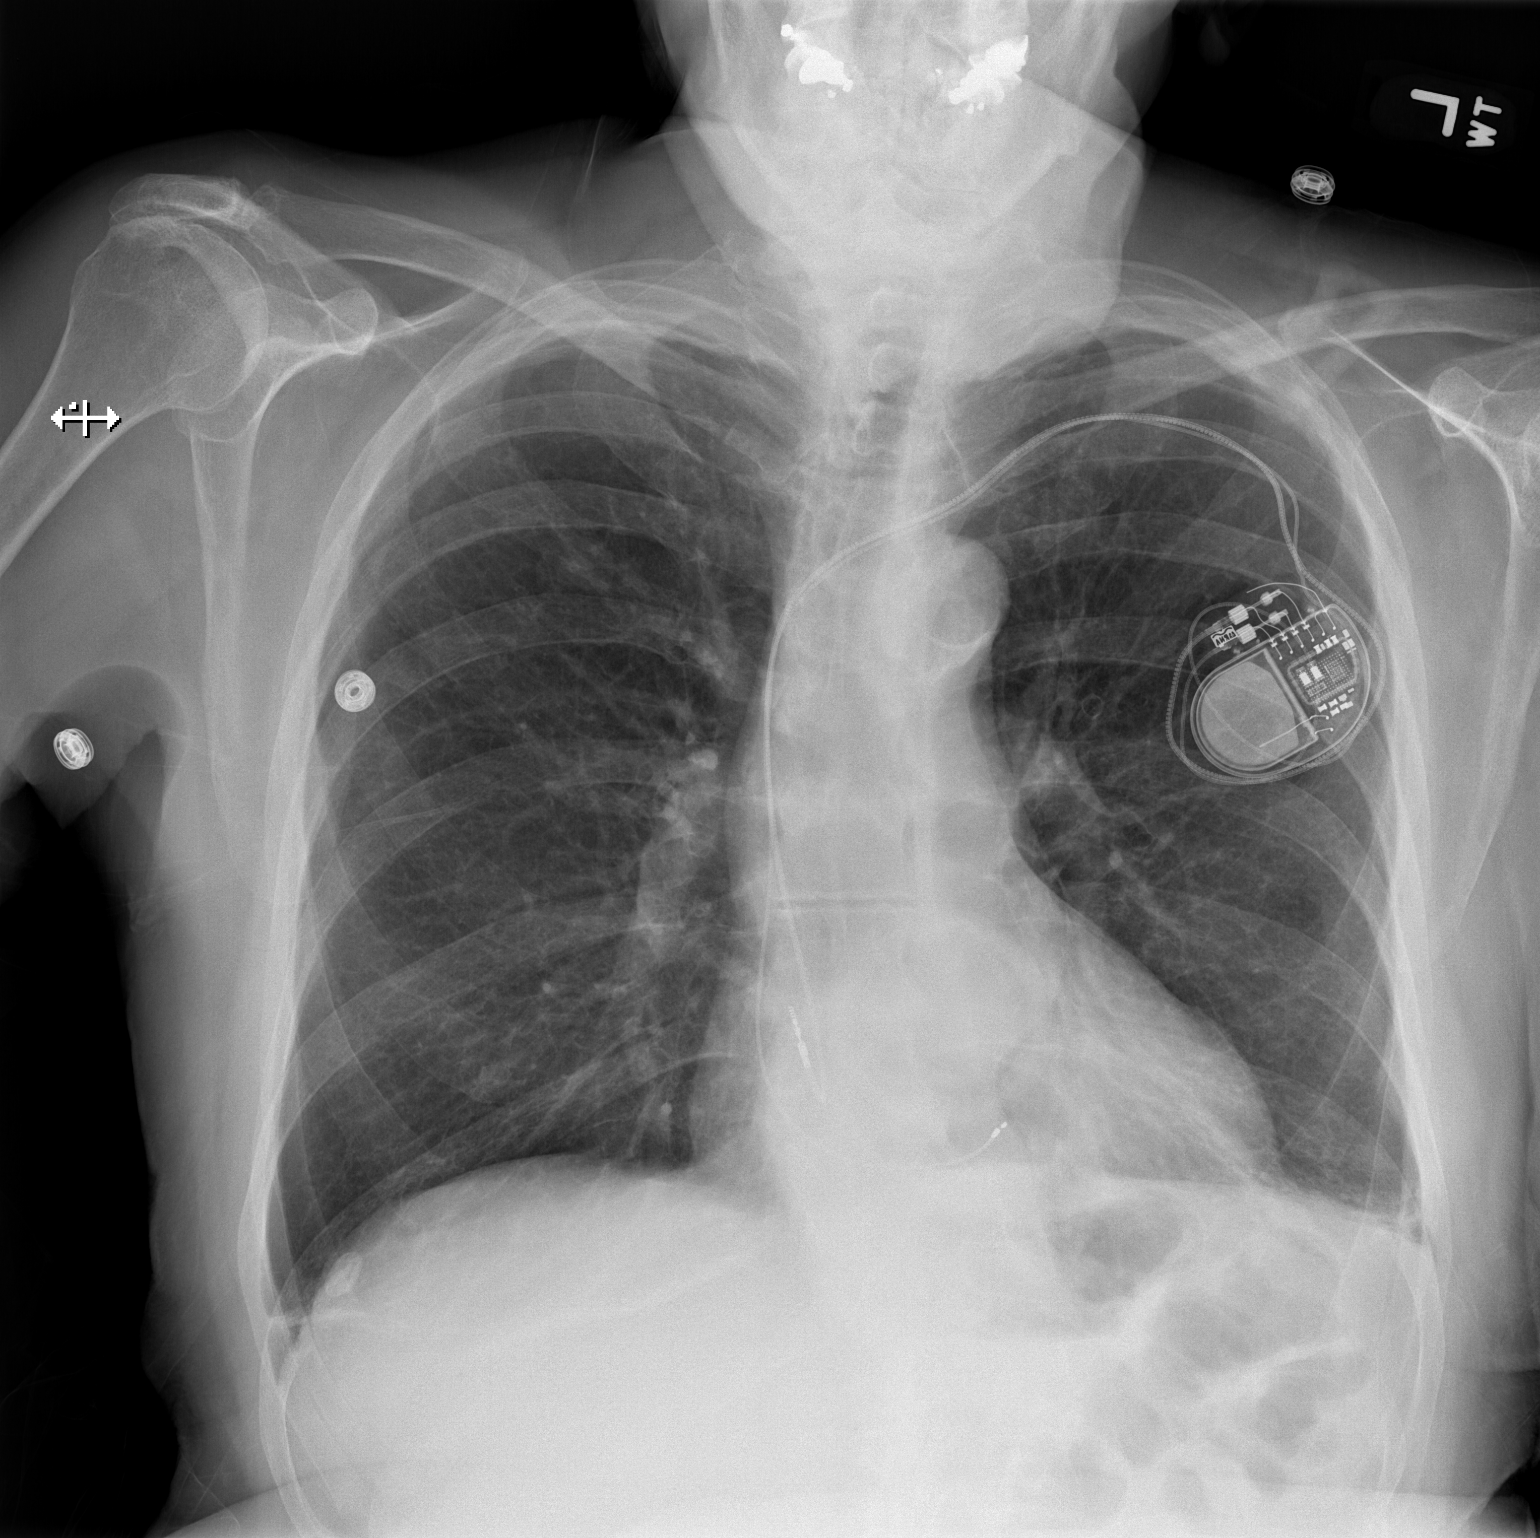

[w chest lat]
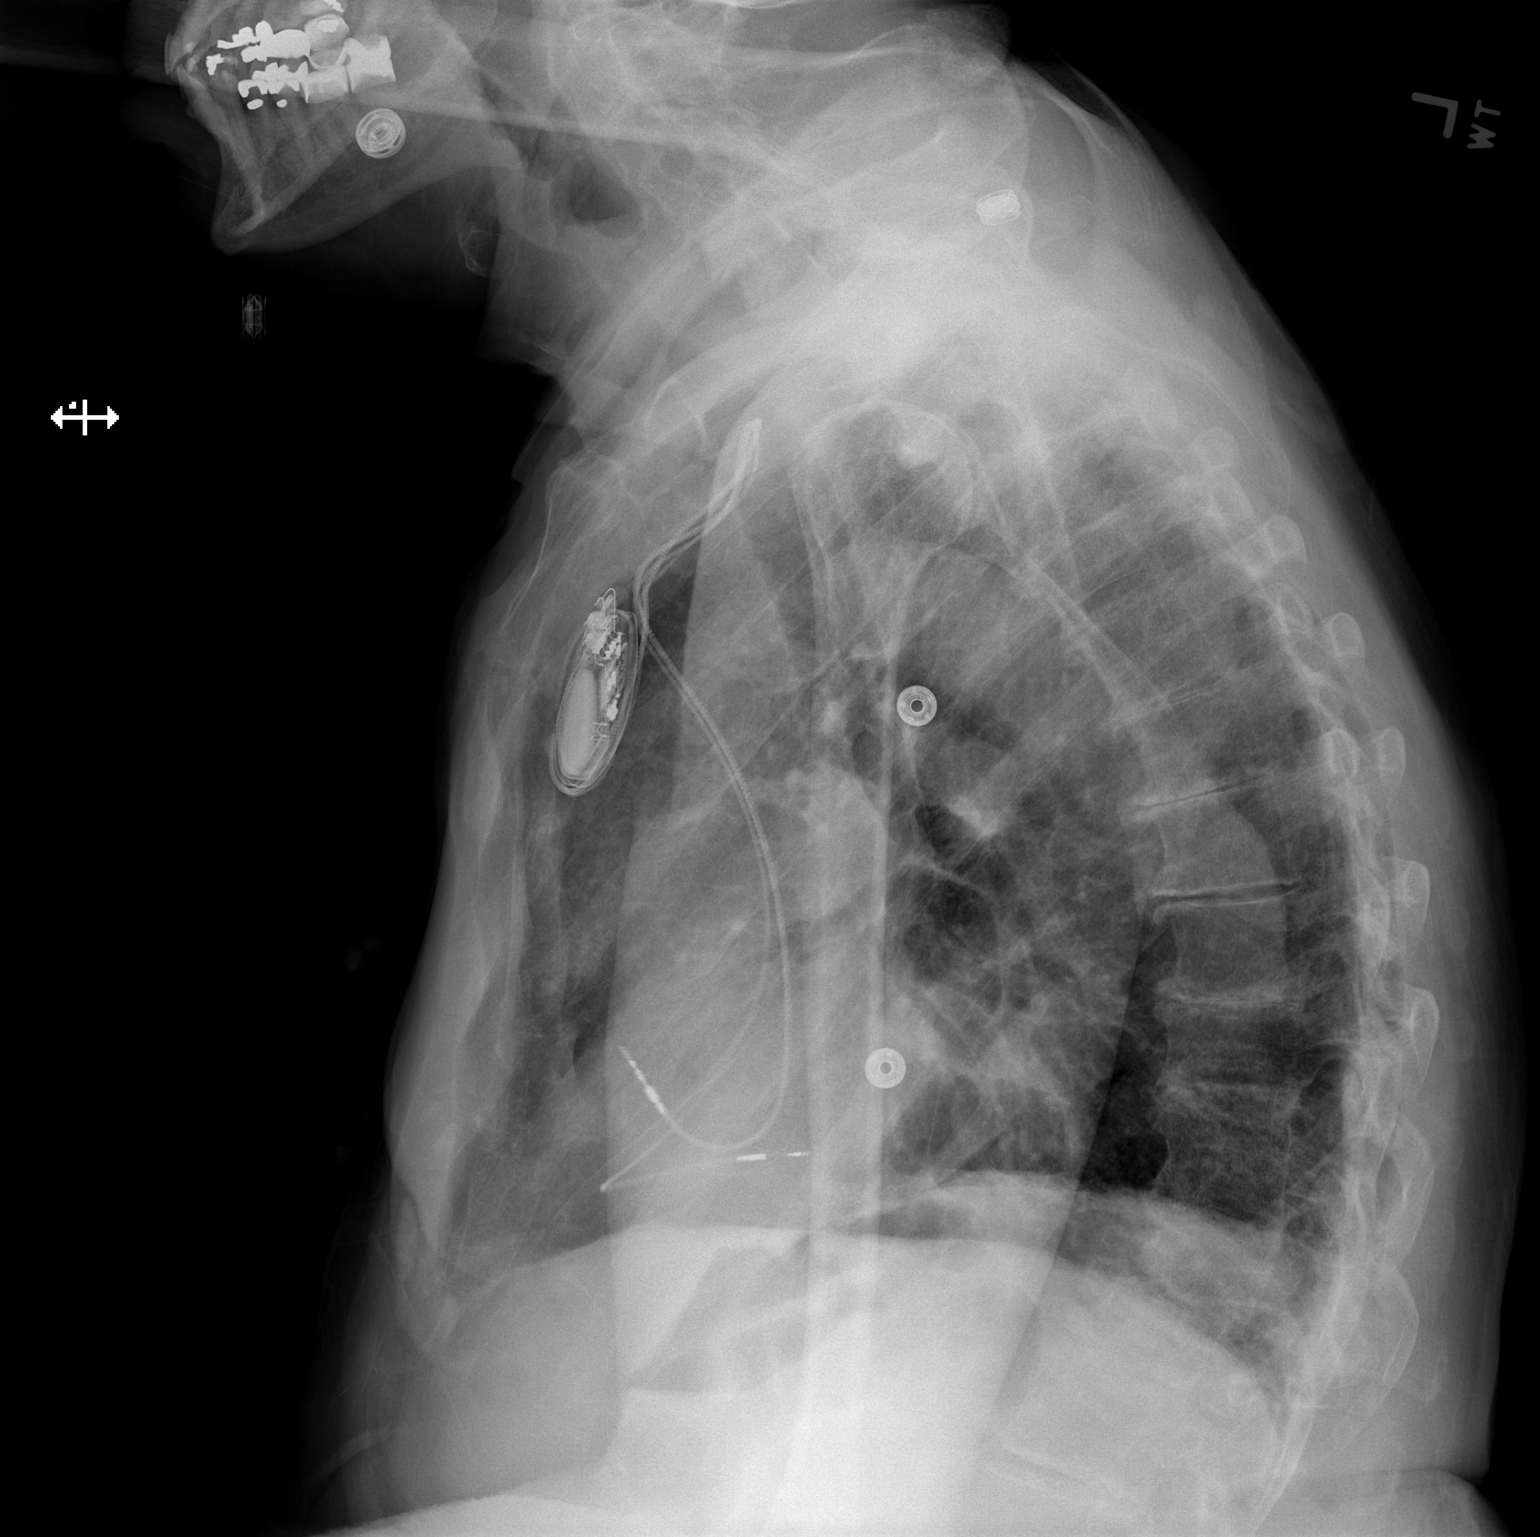

[2 of 2 positions shown; findings below may reference images not displayed]

FINDINGS: Dual lead pacer device in situ. Cardiomediastinal contours are
stable. Pacer leads project over the RIGHT heart entering via LEFT
subclavian approach.

Tortuous aortic contour. The silhouette of the distal thoracic aorta
suggests there may be dilation in this area.

Lungs are clear. Minimal scarring is noted at the LEFT lung base
similar to the study of 2684.

Lateral view shows potential listhesis of the lumbar spine at
perhaps the T12-L1 level with compression deformity, this area is
not well assessed and was without this potential abnormality on
previous imaging performed in 2684.
IMPRESSION: 1. Pacemaker placement without complicating features.
2. Possible aneurysmal dilatation of the distal thoracic aorta.
Follow-up CT imaging may be helpful. Some of this silhouette could
also be due to hiatal hernia in this location, this is unclear.
3. Potential interval fracture of the thoracic spine, finding is age
indeterminate and not well assessed. Dedicated imaging of the spine
may be helpful.

These results were called by telephone at the time of interpretation
on 08/18/2019 at [DATE] to provider INGUNN HARPA RONLOR , who verbally
acknowledged these results.

## 2021-08-16 ENCOUNTER — Ambulatory Visit (INDEPENDENT_AMBULATORY_CARE_PROVIDER_SITE_OTHER): Payer: Medicare Other

## 2021-08-16 DIAGNOSIS — I441 Atrioventricular block, second degree: Secondary | ICD-10-CM | POA: Diagnosis not present

## 2021-08-16 LAB — CUP PACEART REMOTE DEVICE CHECK
Battery Remaining Longevity: 87 mo
Battery Voltage: 3 V
Brady Statistic AP VP Percent: 19.74 %
Brady Statistic AP VS Percent: 0 %
Brady Statistic AS VP Percent: 79.74 %
Brady Statistic AS VS Percent: 0.52 %
Brady Statistic RA Percent Paced: 19.76 %
Brady Statistic RV Percent Paced: 99.48 %
Date Time Interrogation Session: 20230509025914
Implantable Lead Implant Date: 20210510
Implantable Lead Implant Date: 20210510
Implantable Lead Location: 753859
Implantable Lead Location: 753860
Implantable Lead Model: 3830
Implantable Lead Model: 5076
Implantable Pulse Generator Implant Date: 20210510
Lead Channel Impedance Value: 342 Ohm
Lead Channel Impedance Value: 361 Ohm
Lead Channel Impedance Value: 437 Ohm
Lead Channel Impedance Value: 475 Ohm
Lead Channel Pacing Threshold Amplitude: 0.625 V
Lead Channel Pacing Threshold Amplitude: 1.75 V
Lead Channel Pacing Threshold Pulse Width: 0.4 ms
Lead Channel Pacing Threshold Pulse Width: 0.4 ms
Lead Channel Sensing Intrinsic Amplitude: 2.5 mV
Lead Channel Sensing Intrinsic Amplitude: 2.5 mV
Lead Channel Sensing Intrinsic Amplitude: 8.875 mV
Lead Channel Sensing Intrinsic Amplitude: 8.875 mV
Lead Channel Setting Pacing Amplitude: 1.75 V
Lead Channel Setting Pacing Amplitude: 3.5 V
Lead Channel Setting Pacing Pulse Width: 0.4 ms
Lead Channel Setting Sensing Sensitivity: 1.2 mV

## 2021-08-30 NOTE — Progress Notes (Signed)
Remote pacemaker transmission.   

## 2021-09-12 ENCOUNTER — Other Ambulatory Visit: Payer: Medicare Other

## 2021-09-12 DIAGNOSIS — Z515 Encounter for palliative care: Secondary | ICD-10-CM

## 2021-09-12 NOTE — Progress Notes (Signed)
[facility] SW NOTE  PATIENT NAME: Steven Mullen DOB: 1928-06-01 MRN: [id number]  PRIMARY CARE PROVIDER: Antony Contras, MD  RESPONSIBLE PARTY:  Acct ID - Guarantor Home Phone Work Phone Relationship Acct Type  [id number] CHET, [phone]  Self P/F     [address] [place], [place] [address]   SOCIAL WORK TELEPHONIC ENCOUNTER (1:00 pm-1:15 pm)  PC SW completed a telephonic encounter with patient's daughter-Pam to obtain a status on patient and provide support. Pam reported that patient had a fall on 5/22 after his legs gave out on him while he was standing on his walker. He did not sustain any injuries. Pam reported that patient is weaker overall, particularly in his legs. He can stand/ambulate with one person assist with his walker. She report increased confusion with patient where he is forgetting to ask for assistance to stand. Patient is getting up unassisted which increases his risk of falls. The family and his hired caregivers are using a gait belt for safety. They have also added a video monitor in the home to monitor patient closely. Patient had some loose, watery stool this morning that has resolved.Patient's appetite is fair as he is eating at least 2 meals per day (breakfast and dinner) with 1 Ensure. Patient's daughter requested a follow-up visit from the palliative care NP. A visit was scheduled for 09/20/21 @ 1:30 pm with K. Highfill. Pam remains open to ongoing palliative care support and visits for her father.  No other concerns noted.   [address] [place], [place]

## 2021-09-20 ENCOUNTER — Other Ambulatory Visit: Payer: Medicare Other | Admitting: Family Medicine

## 2021-09-20 VITALS — BP 102/62 | HR 64 | Resp 18

## 2021-09-20 DIAGNOSIS — F03A0 Unspecified dementia, mild, without behavioral disturbance, psychotic disturbance, mood disturbance, and anxiety: Secondary | ICD-10-CM

## 2021-09-21 ENCOUNTER — Encounter: Payer: Self-pay | Admitting: Family Medicine

## 2021-09-21 NOTE — Progress Notes (Signed)
[facility] Pall[facility] Consult Note Telephone: [phone]  Fax: [phone]    Date of encounter: 09/20/21 1:45 PM PATIENT NAME: Steven Mullen [address] [place] [address]   [phone] (home)  DOB: 1928/10/02 MRN: [id number] PRIMARY CARE PROVIDER:    Antony Contras, MD,  [address] [place] [address] [place] PROVIDER:   Antony Contras, MD [place],   [address] [phone]  RESPONSIBLE PARTY:    Contact Information     Name Relation Home Work Steven Mullen Daughter [phone]  [phone]   Steven Mullen Relative   [phone]   Steven Mullen   [phone]   Steven Mullen   [phone]        I met face to face with patient and daughter Steven Mullen in their home. Pt has private paid caregiver who assists pt and family has nighttime caregivers for pt.  Palliative Care was asked to follow this patient by consultation request of  Antony Contras, MD to address advance care planning and complex medical decision making. This is a follow up visit.  ASSESSMENT, SYMPTOM MANAGEMENT AND PLAN / RECOMMENDATIONS:   Mild dementia, unspecified without mood disturbance Showing some progression with need for depends for urinary incontinence, assistance with bathing, dressing and toileting.  FAST 7 score 6d. Discussed normal progression of disease including difficulty swallowing with coughing or choking after eating or drinking, sleeping more, loss of continence.     Follow up Palliative Care Visit: Palliative care will continue to follow for complex medical decision making, advance care planning, and clarification of goals. Return 11-12 weeks or prn at family request.   This visit was coded based on medical decision making (MDM).  PPS: 50%  HOSPICE ELIGIBILITY/DIAGNOSIS: TBD  Chief Complaint:  Palliative Care is following for chronic medical management  in setting of dementia.    HISTORY OF PRESENT ILLNESS:  Steven Mullen is a 88 y.o. year old male with dementia who also has a hx of heart block, s/p PPM, osteoporosis and BPH with LUTS. Pt had severe syncope and bradycardia with Tamsulosin per daughter but has had significant decrease in LUTS symptoms since starting on Finasteride.  Daughter provides the bulk of history today.  Pt appears sleepy but will answer a direct question if asked.  She states he is sleeping more every day and that he is only awake for 4-6 hours per day, usually in the evenings. Appetite is ok and neither he nor daughter think he has lost weight.  His daughter states that when he stands "his legs shake like they are about to give out and he has to sit down right away" so they have started using a gait belt to prevent falls.  She assisted him to the floor in May when he was trying to get on the [facility] and his legs began to give out, stating that they both went down but he received no injury or bruising. She states he likes to follow baseball in the evenings and is a [employer] fan.  Denies pain, CP, SOB, lightheadedness, nausea, vomiting, dysuria or constipation. Had interrogation of his pacemaker on 08/16/21 otherwise has had no episodes of care, labs or imaging. Pam asked about ways to address use of briefs for patient if legs were weak as she has discussed with pt and he has not been in favor.  Encouraged her to consider offering a urinal at bedside as he is more likely to  accept this and then transition if needed to using briefs.  She also asked about when a hospital bed might be indicated.  Advised that if pt is bedbound and having increased difficulty with bed mobility, any swelling of legs, orthopnea/PND or potential aspiration then a hospital bed may be beneficial to address these issues.  She states pt is declining some but she does not necessarily want monthly follow up that she will follow up at end of August but will call if problems  arise before then.  She requests that copy of note be sent to PCP to keep him apprised. Pt is noted to be rolling his head around to alleviate some neck stiffness but otherwise appears to be falling asleep if not stimulated.  History obtained from review of EMR, discussion with daughter Steven Mullen and/or Steven Mullen.  I reviewed available labs, medications, imaging, studies and related documents from the EMR.  Records reviewed and summarized above.   ROS  General: NAD ENMT: denies dysphagia Cardiovascular: denies chest pain, denies DOE Pulmonary: denies cough, denies increased SOB Abdomen: endorses fair appetite/no weight loss, denies constipation, endorses continence of bowel GU: denies dysuria, endorses continence of urine MSK:  endorses increased general weakness,  single assisted fall reported without injury Neurological: denies pain, denies insomnia   Physical Exam: Current and past weights: no recent weight on file Constitutional: NAD, appears fatigued General: frail appearing, thin, grayish discoloration to complexion ENMT: intact hearing, oral mucous membranes moist, dentition intact CV: S1S2, RRR, no LE edema Pulmonary: CTAB except fine crackles in LLL, no increased work of breathing, no cough, room air Abdomen: normo-active BS + 4 quadrants, soft and non tender, no ascites MSK: no sarcopenia, moves all extremities, ambulatory Neuro:  noted generalized weakness,   Psych: non-anxious affect, A and O x 3    Thank you for the opportunity to participate in the care of Steven Mullen.  The palliative care team will continue to follow. Please call our office at [phone] if we can be of additional assistance.   Steven Conception, FNP -C  COVID-19 PATIENT SCREENING TOOL Asked and negative response unless otherwise noted:   Have you had symptoms of covid, tested positive or been in contact with someone with symptoms/positive test in the past 5-10 days?   no

## 2021-09-23 DIAGNOSIS — F331 Major depressive disorder, recurrent, moderate: Secondary | ICD-10-CM | POA: Diagnosis not present

## 2021-10-10 ENCOUNTER — Telehealth: Payer: Self-pay | Admitting: Family Medicine

## 2021-10-10 NOTE — Telephone Encounter (Signed)
Pt's daughter is stating that pt is having more difficulty with standing up, having violent shakes/tremors and is becoming more of a fall risk. She is wanting to pursue a "double-sized, fully electric" hospital bed and states she wants input into the bed.  She also indicated that she was willing to pay for amenities like a pressure alternating mattress out of pocket if not covered by Medicare.  She has found a DME supplier that has some of the features she is interested in and was encouraged to speak with insurance provider to see if they would cover this vendor.  Advised that [facility] would likely be the provider in this area.  Advised that medicare would cover a semi-electric bed and would only cover a pressure alternating mattress if there were wounds present.  She states pt continues to eat 2-3 meals per day but amount and time of day varies.  After most recent visit, pt became severely disoriented and indicated that he was confused, not knowing who he was or where he was.  Daughter states more and more he is requiring instruction to be able to do ADLs like hygiene for toileting.  Due to his fall risk she has requested use of incontinence briefs that if he is having a weaker day he doesn't have to push himself to get up to the [facility].  She states pt is resistant at present.  She will follow up with the provider of her choice for bed and with her insurance plan to see if this is a covered DME provider.  Advised that this provider would be able to assist with DME process and she will email me later today with further information so that I may proceed. Damaris Hippo FNP-C

## 2021-10-13 ENCOUNTER — Telehealth: Payer: Self-pay | Admitting: Family Medicine

## 2021-10-13 NOTE — Telephone Encounter (Signed)
Late entry for 10/10/21 4:52 PM: Spoke with pt's daughter by phone and she states that pt is having significant weakness and tremors at times on standing with less endurance to go from chair to bedside commode and bed. Pt has had 2 episodes of significant confusion and is currently requiring verbal instruction from his daughter to carry out some ADLs like toileting.  She is concerned about risk for fall and is asking for hospital bed in case he needs to remain in bed.  She wants to have final approval of the bed and is willing to cover the difference between what Medicare covers and the features she wants.  She wants a fully electric, double bed with a pressure alternating mattress.  Encouraged her to check with his insurance provider to see if the company she is interested in has

## 2021-10-14 ENCOUNTER — Encounter: Payer: Self-pay | Admitting: *Deleted

## 2021-10-14 NOTE — Progress Notes (Signed)
Received request from Damaris Hippo FNP to fax an order for a fully electric hospital bed with pressure reducing surface to [facility]. Demographic sheet, last progress note from 0/16/01, certificate of medical necessity and DME order faxed to [facility] as requested. Confirmation fax received.

## 2021-11-15 ENCOUNTER — Ambulatory Visit (INDEPENDENT_AMBULATORY_CARE_PROVIDER_SITE_OTHER): Payer: Medicare Other

## 2021-11-15 DIAGNOSIS — I441 Atrioventricular block, second degree: Secondary | ICD-10-CM | POA: Diagnosis not present

## 2021-11-15 LAB — CUP PACEART REMOTE DEVICE CHECK
Battery Remaining Longevity: 64 mo
Battery Voltage: 2.99 V
Brady Statistic AP VP Percent: 20.57 %
Brady Statistic AP VS Percent: 0.01 %
Brady Statistic AS VP Percent: 79.33 %
Brady Statistic AS VS Percent: 0.1 %
Brady Statistic RA Percent Paced: 20.46 %
Brady Statistic RV Percent Paced: 99.9 %
Date Time Interrogation Session: 20230808035809
Implantable Lead Implant Date: 20210510
Implantable Lead Implant Date: 20210510
Implantable Lead Location: 753859
Implantable Lead Location: 753860
Implantable Lead Model: 3830
Implantable Lead Model: 5076
Implantable Pulse Generator Implant Date: 20210510
Lead Channel Impedance Value: 323 Ohm
Lead Channel Impedance Value: 323 Ohm
Lead Channel Impedance Value: 437 Ohm
Lead Channel Impedance Value: 494 Ohm
Lead Channel Pacing Threshold Amplitude: 0.75 V
Lead Channel Pacing Threshold Amplitude: 2.125 V
Lead Channel Pacing Threshold Pulse Width: 0.4 ms
Lead Channel Pacing Threshold Pulse Width: 0.4 ms
Lead Channel Sensing Intrinsic Amplitude: 2.875 mV
Lead Channel Sensing Intrinsic Amplitude: 2.875 mV
Lead Channel Sensing Intrinsic Amplitude: 7.375 mV
Lead Channel Sensing Intrinsic Amplitude: 7.375 mV
Lead Channel Setting Pacing Amplitude: 1.5 V
Lead Channel Setting Pacing Amplitude: 4.25 V
Lead Channel Setting Pacing Pulse Width: 0.4 ms
Lead Channel Setting Sensing Sensitivity: 1.2 mV

## 2021-12-06 ENCOUNTER — Telehealth: Payer: Self-pay | Admitting: *Deleted

## 2021-12-06 NOTE — Patient Outreach (Signed)
  Care Coordination   Initial Visit Note   12/06/2021 Name: Steven Mullen MRN: [id number] DOB: [birth year]  Steven Mullen is a 88 y.o. year old male who sees Steven Contras, MD for primary care. I spoke with  Steven Mullen, daughter of Steven Mullen by phone today.  What matters to the patients health and wellness today?  Care Coordination for possible additional care support services to aide in caring for pt who is primarily bedbound.     Goals Addressed             This Visit's Progress    Care Coordination Activities- Scheduled for intake call 12/08/21        Care Management   Outreach Note  12/06/2021 Name: Steven Mullen MRN: [id number] DOB: 13-Feb-1929  Successful contact was made with patient's daughter to discuss care management and care coordination services. Daughter wishes to schedule intake call for 12/08/21.   Follow Up Plan:  Telephone follow up appointment with care management team member scheduled for:12/08/21  Steven Mullen MSW, LCSW Licensed Clinical Social Worker      [phone]         SDOH assessments and interventions completed:  Yes  SDOH Interventions Today    Flowsheet Row Most Recent Value  SDOH Interventions   Food Insecurity Interventions Intervention Not Indicated  Housing Interventions Intervention Not Indicated  Physical Activity Interventions Intervention Not Indicated  [bedbound primarily]  Transportation Interventions Intervention Not Indicated  [pt is homebound- family is aware of transportation options but feels it is too straining for pt]        Care Coordination Interventions Activated:  Yes  Care Coordination Interventions:  Yes, provided   Follow up plan: Follow up call scheduled for 8/31/233    Encounter Outcome:  Pt. Visit Completed   Steven Mullen MSW, LCSW Licensed Clinical Social Worker      [phone]

## 2021-12-07 ENCOUNTER — Other Ambulatory Visit: Payer: Medicare Other | Admitting: Family Medicine

## 2021-12-07 ENCOUNTER — Encounter: Payer: Self-pay | Admitting: Family Medicine

## 2021-12-07 VITALS — BP 122/78 | HR 72 | Resp 16

## 2021-12-07 DIAGNOSIS — N401 Enlarged prostate with lower urinary tract symptoms: Secondary | ICD-10-CM | POA: Diagnosis not present

## 2021-12-07 DIAGNOSIS — R350 Frequency of micturition: Secondary | ICD-10-CM | POA: Diagnosis not present

## 2021-12-07 DIAGNOSIS — F03B0 Unspecified dementia, moderate, without behavioral disturbance, psychotic disturbance, mood disturbance, and anxiety: Secondary | ICD-10-CM

## 2021-12-07 NOTE — Progress Notes (Signed)
Therapist, nutritional Palliative Care Consult Note Telephone: [phone]  Fax: [phone]    Date of encounter: 09/20/21 1:45 PM PATIENT NAME: Steven Mullen [address] [place] [address]   [phone] (home)  DOB: 21-Jun-1928 MRN: [id number] PRIMARY CARE PROVIDER:    Tally Joe, MD,  [address] [place] [address] [phone]  REFERRING PROVIDER:   Tally Joe, MD [address] [place],  [place] [address] [phone]  RESPONSIBLE PARTY:    Contact Information     Name Relation Home Work [place] Daughter [phone]  [phone]   Magdalen Spatz Relative   [phone]   [place], Jeff   [phone]   Cheryl, [place]   [phone]        I met face to face with patient and daughter Steven Mullen in their home. Son Steven Mullen came in later. Pt has private paid caregiver who assists pt and family has nighttime caregivers for pt.  Palliative Care was asked to follow this patient by consultation request of  Steven Joe, MD to address advance care planning and complex medical decision making. This is a follow up visit.  ASSESSMENT, SYMPTOM MANAGEMENT AND PLAN / RECOMMENDATIONS:   Moderate dementia, unspecified without mood disturbance. FAST 7 score 6d. Showing continued progression with increased weakness, more time sleeping and less ambulatory. Continues Aricept 5 mg daily Advised when giving cues to make simple one step directions at a time and sometimes mimicking the activity may help the patient remember the correct way to do an activity. (Like mimicking brushing teeth to show the correct way to do it)  2.  BPH with LUTS of urinary frequency Had episode of incontinence with prior inability to void, weakness and increased fatigue/confusion Start Cefdinir 300 mg BID x 5 days for presumptive UTI      Follow up Palliative Care Visit: Palliative care will continue to follow for complex medical  decision making, advance care planning, and clarification of goals. Return 1 week prior to Thanksgiving at family request or prn.   This visit was coded based on medical decision making (MDM).  PPS: 40%  HOSPICE ELIGIBILITY/DIAGNOSIS: TBD  Chief Complaint:  Palliative Care is following for chronic medical management in setting of dementia with daughter complaining of increased weakness and confusion with concern for UTI.  HISTORY OF PRESENT ILLNESS:  Steven Mullen is a 88 y.o. year old male with dementia who also has a hx of heart block, s/p PPM, osteoporosis and BPH with LUTS. Pt had severe syncope and bradycardia with Tamsulosin per daughter but has had significant decrease in LUTS symptoms since starting on Finasteride.  Daughter provides the bulk of history today. He has been sleeping more, not wanting to get up until 3 pm and then he eats 1 full meal and a sandwich.  He has generalized trunk instability on standing and seems very unsteady with poor balance with eating more meals in the bed in the last 2 weeks.  He had an episode where he couldn't urinate then urinated in the pad and in the bedside commode. She states he is having regular Bms.  He has been more disoriented in the last few days, weaker. They ordered a double hospital bed and private paid for it. She states he is in bed for 16-20 hours daily, when he sits up for a while he becomes very fatigued.  Intermittently incontinent of bladder, continent of bowel.  She thinks he has lost some weight, his pants  are easier to get up.  She states no CP, SOB. Has chronic rhinorrhea and intermittent dry cough not related to eating or drinking. Daughter states that at times pt will ask her what he needs to do because he can't seem to start an activity or remember the sequence, for example, getting off the bedside commode so daughter advises him step by step. Pt has woken up during the evening disoriented. She states she cancelled his Urology  appointment with [facility] because she doesn't think she can get him in the car but he needs his proscar which has made a huge difference with his LUTS from his BPH.  Advised that Palliative Care NP can provide prescription for him. She has had a call from [facility] regarding their services.  This provider had spoken with [facility] Eduard Clos, SW prior to visit.  She states that they have been able to have remote video visits with PCP.  Advised that similar to Palliative they do have providers that can come to the house if they were interested in changing from Dr Moreen Fowler but she wants to know if there are additional benefits.  Encouraged her to ask on follow up call if they provide home immunizations for influenza and she wants to get Covid vaccine updated. She will have follow up call with Marcie Bal tomorrow.  History obtained from review of EMR, discussion with daughter Steven Mullen and/or Steven Mullen.    I reviewed EMR for available labs, medications, imaging, studies and related documents  but only new data was from pacemaker interrogation with 64 month battery life.  ROS General: NAD, lying in bed ENMT: denies dysphagia, some cough without relevance to intake, long standing clear rhinorrhea Cardiovascular: denies chest pain, denies DOE Pulmonary: non-productive cough, denies increased SOB Abdomen: endorses fair appetite/no significant weight loss, denies constipation, endorses continence of bowel GU: denies dysuria, endorses occasional incontinence of urine MSK:  endorses increased general weakness, denies falls Neurological: denies pain, denies insomnia   Physical Exam: Current and past weights: no recent weight on file Constitutional: NAD, appears fatigued General: frail appearing, thin CV: S1S2, RRR, no LE edema Pulmonary: CTAB bilat, no increased work of breathing, no cough, room air Abdomen: normo-active BS + 4 quadrants, soft and non tender MSK: no sarcopenia, moves all extremities, able to  transfer bed to [facility] Neuro:  noted generalized weakness,   Psych: non-anxious affect, A and O x 2    Thank you for the opportunity to participate in the care of Mr. Mitro.  The palliative care team will continue to follow. Please call our office at [phone] if we can be of additional assistance.   Marijo Conception, FNP -C  COVID-19 PATIENT SCREENING TOOL Asked and negative response unless otherwise noted:   Have you had symptoms of covid, tested positive or been in contact with someone with symptoms/positive test in the past 5-10 days?   no

## 2021-12-08 ENCOUNTER — Ambulatory Visit: Payer: Self-pay | Admitting: *Deleted

## 2021-12-08 ENCOUNTER — Telehealth: Payer: Self-pay | Admitting: Family Medicine

## 2021-12-08 NOTE — Telephone Encounter (Signed)
Spoke with Mickel Baas to give message to Dr Gloriann Loan regarding prescription for Proscar.  She states that pt's daughter had already called in and Dr Gloriann Loan had sent the prescription with a year's worth of refills. Damaris Hippo FNP-C

## 2021-12-08 NOTE — Patient Outreach (Signed)
  Care Coordination   Initial Visit Note   12/08/2021 Name: Steven Mullen MRN: [id number] DOB: 1928/11/11  Steven Mullen is a 88 y.o. year old male who sees Antony Contras, MD for primary care. I spoke with  Steven Mullen, daughter of Steven Mullen by phone today.  What matters to the patients health and wellness today?  Getting update vaccinations and dental visit (at home if possible).   Patient residing at home with daughter and some private duty care. Pt is also active with Palliative Care. Discussed with daughter our Care Coordination team support services. Advised daughter we do not have any resources to offer for getting his vaccinations and dental care in the home.  Suggested speaking with the Health Dept, PCP, etc to seek further knowledge of options. Family is well equipped with support in the home and with the Palliative Care team. Daughter will reach out if needs arise we can assist with  going forward.     Goals Addressed             This Visit's Progress    COMPLETED: Care Coordination Activities- Scheduled for intake call 12/08/21        Care Management   Outreach Note  12/06/2021 Name: Steven Mullen MRN: [id number] DOB: [birth year]  Successful contact was made with patient's daughter to discuss care management and care coordination services. Daughter wishes to schedule intake call for 12/08/21.   Follow Up Plan:  Telephone follow up appointment with care management team member scheduled for:12/08/21  Eduard Clos MSW, LCSW Licensed Clinical Social Worker      [phone]         SDOH assessments and interventions completed:  Yes  SDOH Interventions Today    Flowsheet Row Most Recent Value  SDOH Interventions   Financial Strain Interventions Intervention Not Indicated        Care Coordination Interventions Activated:  Yes  Care Coordination Interventions:  Yes, provided   Follow up plan: No further intervention required.   Encounter Outcome:  Pt. Visit  Completed   Eduard Clos MSW, LCSW Licensed Clinical Social Worker      [phone]

## 2021-12-21 NOTE — Progress Notes (Signed)
Remote pacemaker transmission.   

## 2021-12-23 ENCOUNTER — Other Ambulatory Visit: Payer: Medicare Other | Admitting: Family Medicine

## 2021-12-23 ENCOUNTER — Encounter: Payer: Self-pay | Admitting: Family Medicine

## 2021-12-23 VITALS — BP 112/80 | Resp 18

## 2021-12-23 DIAGNOSIS — R350 Frequency of micturition: Secondary | ICD-10-CM | POA: Diagnosis not present

## 2021-12-23 DIAGNOSIS — N401 Enlarged prostate with lower urinary tract symptoms: Secondary | ICD-10-CM | POA: Diagnosis not present

## 2021-12-23 DIAGNOSIS — Z515 Encounter for palliative care: Secondary | ICD-10-CM

## 2021-12-23 DIAGNOSIS — F03B0 Unspecified dementia, moderate, without behavioral disturbance, psychotic disturbance, mood disturbance, and anxiety: Secondary | ICD-10-CM

## 2021-12-24 ENCOUNTER — Encounter: Payer: Self-pay | Admitting: Family Medicine

## 2021-12-24 NOTE — Progress Notes (Unsigned)
Therapist, nutritional Palliative Care Consult Note Telephone: [phone]  Fax: [phone]    Date of encounter: 09/20/21 1:45 PM PATIENT NAME: Steven Mullen [address] [place] [address]   [phone] (home)  DOB: 1928/10/04 MRN: [id number] PRIMARY CARE PROVIDER:    Tally Joe, MD,  [address] [place] [address] [phone]  REFERRING PROVIDER:   Tally Joe, MD [address] [place],  [place] [address] [phone]  RESPONSIBLE PARTY:    Contact Information     Name Relation Home Work [place] Daughter [phone]  [phone]   Magdalen Spatz Relative   [phone]   Ithan, [place]   [phone]   [place], [place]   [phone]        I met face to face with patient and daughter Elita Quick in their home. Son Raiford Noble came in later. Pt has private paid caregiver who assists pt and family has nighttime caregivers for pt.  Palliative Care was asked to follow this patient by consultation request of  Tally Joe, MD to address advance care planning and complex medical decision making. This is a follow up visit.  ASSESSMENT, SYMPTOM MANAGEMENT AND PLAN / RECOMMENDATIONS:   Moderate dementia, unspecified without mood disturbance. FAST 7 score 6d. Showing continued progression with increased weakness, more time sleeping and less ambulatory. Continues Aricept 5 mg daily Advised when giving cues to make simple one step directions at a time and sometimes mimicking the activity may help the patient remember the correct way to do an activity. (Like mimicking brushing teeth to show the correct way to do it)  2.  BPH with LUTS of urinary frequency Had episode of incontinence with prior inability to void, weakness and increased fatigue/confusion Start Cefdinir 300 mg BID x 5 days for presumptive UTI      Follow up Palliative Care Visit: Palliative care will continue to follow for complex medical  decision making, advance care planning, and clarification of goals. Return 1 week prior to Thanksgiving at family request or prn.   This visit was coded based on medical decision making (MDM).  PPS: 40%  HOSPICE ELIGIBILITY/DIAGNOSIS: TBD  Chief Complaint:  Acute visit requested by family to assess current health in setting of dementia with poor food and fluid intake, decline in ADLs and increased sleeping.  HISTORY OF PRESENT ILLNESS:  Steven Mullen is a 88 y.o. year old male with dementia who also has a hx of heart block, s/p PPM, osteoporosis and BPH with LUTS. Pt had severe syncope and bradycardia with Tamsulosin per daughter but has had significant decrease in LUTS symptoms since starting on Finasteride.  Daughter provides the bulk of history today. He has been sleeping more, not wanting to get up until 3 pm and then he eats 1 full meal and a sandwich.  He has generalized trunk instability on standing and seems very unsteady with poor balance with eating more meals in the bed in the last 2 weeks.  He had an episode where he couldn't urinate then urinated in the pad and in the bedside commode. She states he is having regular Bms.  He has been more disoriented in the last few days, weaker. They ordered a double hospital bed and private paid for it. She states he is in bed for 16-20 hours daily, when he sits up for a while he becomes very fatigued.  Intermittently incontinent of bladder, continent of bowel.  She thinks he has lost some weight, his  pants are easier to get up.  She states no CP, SOB. Has chronic rhinorrhea and intermittent dry cough not related to eating or drinking. Daughter states that at times pt will ask her what he needs to do because he can't seem to start an activity or remember the sequence, for example, getting off the bedside commode so daughter advises him step by step. Pt has woken up during the evening disoriented. She states she cancelled his Urology appointment with  [facility] because she doesn't think she can get him in the car but he needs his proscar which has made a huge difference with his LUTS from his BPH.  Advised that Palliative Care NP can provide prescription for him. She has had a call from [facility] regarding their services.  This provider had spoken with [facility] Eduard Clos, SW prior to visit.  She states that they have been able to have remote video visits with PCP.  Advised that similar to Palliative they do have providers that can come to the house if they were interested in changing from Dr Moreen Fowler but she wants to know if there are additional benefits.  Encouraged her to ask on follow up call if they provide home immunizations for influenza and she wants to get Covid vaccine updated. She will have follow up call with Marcie Bal tomorrow.  History obtained from review of EMR, discussion with daughter Jeannene Patella and/or Mr. Attridge.    I reviewed EMR for available labs, medications, imaging, studies and related documents  but only new data was from pacemaker interrogation with 64 month battery life.  ROS General: NAD, lying in bed ENMT: denies dysphagia, some cough without relevance to intake, long standing clear rhinorrhea Cardiovascular: denies chest pain, denies DOE Pulmonary: non-productive cough, denies increased SOB Abdomen: endorses fair appetite/no significant weight loss, denies constipation, endorses continence of bowel GU: denies dysuria, endorses occasional incontinence of urine MSK:  endorses increased general weakness, denies falls Neurological: denies pain, denies insomnia   Physical Exam: Current and past weights: no recent weight on file Constitutional: NAD, appears fatigued General: frail appearing, thin CV: S1S2, RRR, no LE edema Pulmonary: CTAB bilat, no increased work of breathing, no cough, room air Abdomen: normo-active BS + 4 quadrants, soft and non tender MSK: no sarcopenia, moves all extremities, able to transfer bed to  [facility] Neuro:  noted generalized weakness,   Psych: non-anxious affect, A and O x 2    Thank you for the opportunity to participate in the care of Mr. Heintzelman.  The palliative care team will continue to follow. Please call our office at [phone] if we can be of additional assistance.   Marijo Conception, FNP -C  COVID-19 PATIENT SCREENING TOOL Asked and negative response unless otherwise noted:   Have you had symptoms of covid, tested positive or been in contact with someone with symptoms/positive test in the past 5-10 days?   no

## 2021-12-25 DIAGNOSIS — Z515 Encounter for palliative care: Secondary | ICD-10-CM | POA: Insufficient documentation

## 2021-12-27 ENCOUNTER — Telehealth: Payer: Self-pay

## 2021-12-27 ENCOUNTER — Telehealth: Payer: Self-pay | Admitting: Family Medicine

## 2021-12-27 NOTE — Telephone Encounter (Signed)
Family is requesting transition to Hospice services.  TCT PCP, left message with reception regarding family request.  Advised pt is c/o suprapubic pain, is confused, sleeping about 22 hrs a day, has been bedbound for over a week with poor po intake.   Left message that need to know if PCP agrees given current course, life expectancy 6 months or less and if he agrees, does he want to remain as attending.  Left number for call back or advised they could put in referral. Damaris Hippo FNP-C

## 2021-12-27 NOTE — Telephone Encounter (Signed)
81- Palliative Telephonic Encounter     Returned call to daughter from message received yesterday regarding request for specimen cup to obtain U/A. Daughter reports that pt is c/o severe burning and discomfort in supra pubic/lower abd area. Also reports some relief of discomfort immediately after voiding. Also reports that pt has had 2 doses of oral cipro (started last evening at 6pm) so far. States that she is willing to come and pick up a specimen cup.   Will notify Chryl Heck., NP of dtr concerns.

## 2022-01-04 DIAGNOSIS — N400 Enlarged prostate without lower urinary tract symptoms: Secondary | ICD-10-CM | POA: Diagnosis not present

## 2022-01-04 DIAGNOSIS — Z7401 Bed confinement status: Secondary | ICD-10-CM | POA: Diagnosis not present

## 2022-01-04 DIAGNOSIS — G311 Senile degeneration of brain, not elsewhere classified: Secondary | ICD-10-CM | POA: Diagnosis not present

## 2022-01-04 DIAGNOSIS — Z87440 Personal history of urinary (tract) infections: Secondary | ICD-10-CM | POA: Diagnosis not present

## 2022-01-04 DIAGNOSIS — F0283 Dementia in other diseases classified elsewhere, unspecified severity, with mood disturbance: Secondary | ICD-10-CM | POA: Diagnosis not present

## 2022-01-05 DIAGNOSIS — Z87440 Personal history of urinary (tract) infections: Secondary | ICD-10-CM | POA: Diagnosis not present

## 2022-01-05 DIAGNOSIS — G311 Senile degeneration of brain, not elsewhere classified: Secondary | ICD-10-CM | POA: Diagnosis not present

## 2022-01-05 DIAGNOSIS — N400 Enlarged prostate without lower urinary tract symptoms: Secondary | ICD-10-CM | POA: Diagnosis not present

## 2022-01-05 DIAGNOSIS — F0283 Dementia in other diseases classified elsewhere, unspecified severity, with mood disturbance: Secondary | ICD-10-CM | POA: Diagnosis not present

## 2022-01-05 DIAGNOSIS — Z7401 Bed confinement status: Secondary | ICD-10-CM | POA: Diagnosis not present

## 2022-01-06 DIAGNOSIS — G311 Senile degeneration of brain, not elsewhere classified: Secondary | ICD-10-CM | POA: Diagnosis not present

## 2022-01-06 DIAGNOSIS — Z87440 Personal history of urinary (tract) infections: Secondary | ICD-10-CM | POA: Diagnosis not present

## 2022-01-06 DIAGNOSIS — F0283 Dementia in other diseases classified elsewhere, unspecified severity, with mood disturbance: Secondary | ICD-10-CM | POA: Diagnosis not present

## 2022-01-06 DIAGNOSIS — N400 Enlarged prostate without lower urinary tract symptoms: Secondary | ICD-10-CM | POA: Diagnosis not present

## 2022-01-06 DIAGNOSIS — Z7401 Bed confinement status: Secondary | ICD-10-CM | POA: Diagnosis not present

## 2022-01-08 DIAGNOSIS — Z87440 Personal history of urinary (tract) infections: Secondary | ICD-10-CM | POA: Diagnosis not present

## 2022-01-08 DIAGNOSIS — N400 Enlarged prostate without lower urinary tract symptoms: Secondary | ICD-10-CM | POA: Diagnosis not present

## 2022-01-08 DIAGNOSIS — Z7401 Bed confinement status: Secondary | ICD-10-CM | POA: Diagnosis not present

## 2022-01-08 DIAGNOSIS — G311 Senile degeneration of brain, not elsewhere classified: Secondary | ICD-10-CM | POA: Diagnosis not present

## 2022-01-08 DIAGNOSIS — F0283 Dementia in other diseases classified elsewhere, unspecified severity, with mood disturbance: Secondary | ICD-10-CM | POA: Diagnosis not present

## 2022-01-09 DIAGNOSIS — Z7401 Bed confinement status: Secondary | ICD-10-CM | POA: Diagnosis not present

## 2022-01-09 DIAGNOSIS — F0283 Dementia in other diseases classified elsewhere, unspecified severity, with mood disturbance: Secondary | ICD-10-CM | POA: Diagnosis not present

## 2022-01-09 DIAGNOSIS — G311 Senile degeneration of brain, not elsewhere classified: Secondary | ICD-10-CM | POA: Diagnosis not present

## 2022-01-09 DIAGNOSIS — Z87440 Personal history of urinary (tract) infections: Secondary | ICD-10-CM | POA: Diagnosis not present

## 2022-01-09 DIAGNOSIS — N400 Enlarged prostate without lower urinary tract symptoms: Secondary | ICD-10-CM | POA: Diagnosis not present

## 2022-01-10 DIAGNOSIS — G311 Senile degeneration of brain, not elsewhere classified: Secondary | ICD-10-CM | POA: Diagnosis not present

## 2022-01-10 DIAGNOSIS — Z87440 Personal history of urinary (tract) infections: Secondary | ICD-10-CM | POA: Diagnosis not present

## 2022-01-10 DIAGNOSIS — N400 Enlarged prostate without lower urinary tract symptoms: Secondary | ICD-10-CM | POA: Diagnosis not present

## 2022-01-10 DIAGNOSIS — F0283 Dementia in other diseases classified elsewhere, unspecified severity, with mood disturbance: Secondary | ICD-10-CM | POA: Diagnosis not present

## 2022-01-10 DIAGNOSIS — Z7401 Bed confinement status: Secondary | ICD-10-CM | POA: Diagnosis not present

## 2022-01-14 DIAGNOSIS — Z87440 Personal history of urinary (tract) infections: Secondary | ICD-10-CM | POA: Diagnosis not present

## 2022-01-14 DIAGNOSIS — N400 Enlarged prostate without lower urinary tract symptoms: Secondary | ICD-10-CM | POA: Diagnosis not present

## 2022-01-14 DIAGNOSIS — F0283 Dementia in other diseases classified elsewhere, unspecified severity, with mood disturbance: Secondary | ICD-10-CM | POA: Diagnosis not present

## 2022-01-14 DIAGNOSIS — Z7401 Bed confinement status: Secondary | ICD-10-CM | POA: Diagnosis not present

## 2022-01-14 DIAGNOSIS — G311 Senile degeneration of brain, not elsewhere classified: Secondary | ICD-10-CM | POA: Diagnosis not present

## 2022-01-17 DIAGNOSIS — Z7401 Bed confinement status: Secondary | ICD-10-CM | POA: Diagnosis not present

## 2022-01-17 DIAGNOSIS — F0283 Dementia in other diseases classified elsewhere, unspecified severity, with mood disturbance: Secondary | ICD-10-CM | POA: Diagnosis not present

## 2022-01-17 DIAGNOSIS — G311 Senile degeneration of brain, not elsewhere classified: Secondary | ICD-10-CM | POA: Diagnosis not present

## 2022-01-17 DIAGNOSIS — Z87440 Personal history of urinary (tract) infections: Secondary | ICD-10-CM | POA: Diagnosis not present

## 2022-01-17 DIAGNOSIS — N400 Enlarged prostate without lower urinary tract symptoms: Secondary | ICD-10-CM | POA: Diagnosis not present

## 2022-01-24 DIAGNOSIS — F0283 Dementia in other diseases classified elsewhere, unspecified severity, with mood disturbance: Secondary | ICD-10-CM | POA: Diagnosis not present

## 2022-01-24 DIAGNOSIS — Z87440 Personal history of urinary (tract) infections: Secondary | ICD-10-CM | POA: Diagnosis not present

## 2022-01-24 DIAGNOSIS — G311 Senile degeneration of brain, not elsewhere classified: Secondary | ICD-10-CM | POA: Diagnosis not present

## 2022-01-24 DIAGNOSIS — F331 Major depressive disorder, recurrent, moderate: Secondary | ICD-10-CM | POA: Diagnosis not present

## 2022-01-24 DIAGNOSIS — N400 Enlarged prostate without lower urinary tract symptoms: Secondary | ICD-10-CM | POA: Diagnosis not present

## 2022-01-24 DIAGNOSIS — Z7401 Bed confinement status: Secondary | ICD-10-CM | POA: Diagnosis not present

## 2022-01-31 DIAGNOSIS — G311 Senile degeneration of brain, not elsewhere classified: Secondary | ICD-10-CM | POA: Diagnosis not present

## 2022-01-31 DIAGNOSIS — Z7401 Bed confinement status: Secondary | ICD-10-CM | POA: Diagnosis not present

## 2022-01-31 DIAGNOSIS — N400 Enlarged prostate without lower urinary tract symptoms: Secondary | ICD-10-CM | POA: Diagnosis not present

## 2022-01-31 DIAGNOSIS — Z87440 Personal history of urinary (tract) infections: Secondary | ICD-10-CM | POA: Diagnosis not present

## 2022-01-31 DIAGNOSIS — F0283 Dementia in other diseases classified elsewhere, unspecified severity, with mood disturbance: Secondary | ICD-10-CM | POA: Diagnosis not present

## 2022-02-07 DIAGNOSIS — N400 Enlarged prostate without lower urinary tract symptoms: Secondary | ICD-10-CM | POA: Diagnosis not present

## 2022-02-07 DIAGNOSIS — Z87440 Personal history of urinary (tract) infections: Secondary | ICD-10-CM | POA: Diagnosis not present

## 2022-02-07 DIAGNOSIS — Z7401 Bed confinement status: Secondary | ICD-10-CM | POA: Diagnosis not present

## 2022-02-07 DIAGNOSIS — G311 Senile degeneration of brain, not elsewhere classified: Secondary | ICD-10-CM | POA: Diagnosis not present

## 2022-02-07 DIAGNOSIS — F0283 Dementia in other diseases classified elsewhere, unspecified severity, with mood disturbance: Secondary | ICD-10-CM | POA: Diagnosis not present

## 2022-02-08 DIAGNOSIS — Z87440 Personal history of urinary (tract) infections: Secondary | ICD-10-CM | POA: Diagnosis not present

## 2022-02-08 DIAGNOSIS — Z7401 Bed confinement status: Secondary | ICD-10-CM | POA: Diagnosis not present

## 2022-02-08 DIAGNOSIS — G311 Senile degeneration of brain, not elsewhere classified: Secondary | ICD-10-CM | POA: Diagnosis not present

## 2022-02-08 DIAGNOSIS — F0283 Dementia in other diseases classified elsewhere, unspecified severity, with mood disturbance: Secondary | ICD-10-CM | POA: Diagnosis not present

## 2022-02-08 DIAGNOSIS — N400 Enlarged prostate without lower urinary tract symptoms: Secondary | ICD-10-CM | POA: Diagnosis not present

## 2022-02-09 DIAGNOSIS — Z87440 Personal history of urinary (tract) infections: Secondary | ICD-10-CM | POA: Diagnosis not present

## 2022-02-09 DIAGNOSIS — F0283 Dementia in other diseases classified elsewhere, unspecified severity, with mood disturbance: Secondary | ICD-10-CM | POA: Diagnosis not present

## 2022-02-09 DIAGNOSIS — Z7401 Bed confinement status: Secondary | ICD-10-CM | POA: Diagnosis not present

## 2022-02-09 DIAGNOSIS — G311 Senile degeneration of brain, not elsewhere classified: Secondary | ICD-10-CM | POA: Diagnosis not present

## 2022-02-09 DIAGNOSIS — N400 Enlarged prostate without lower urinary tract symptoms: Secondary | ICD-10-CM | POA: Diagnosis not present

## 2022-02-14 ENCOUNTER — Ambulatory Visit (INDEPENDENT_AMBULATORY_CARE_PROVIDER_SITE_OTHER): Payer: Medicare Other

## 2022-02-14 DIAGNOSIS — I441 Atrioventricular block, second degree: Secondary | ICD-10-CM | POA: Diagnosis not present

## 2022-02-14 DIAGNOSIS — N400 Enlarged prostate without lower urinary tract symptoms: Secondary | ICD-10-CM | POA: Diagnosis not present

## 2022-02-14 DIAGNOSIS — Z7401 Bed confinement status: Secondary | ICD-10-CM | POA: Diagnosis not present

## 2022-02-14 DIAGNOSIS — F0283 Dementia in other diseases classified elsewhere, unspecified severity, with mood disturbance: Secondary | ICD-10-CM | POA: Diagnosis not present

## 2022-02-14 DIAGNOSIS — Z87440 Personal history of urinary (tract) infections: Secondary | ICD-10-CM | POA: Diagnosis not present

## 2022-02-14 DIAGNOSIS — G311 Senile degeneration of brain, not elsewhere classified: Secondary | ICD-10-CM | POA: Diagnosis not present

## 2022-02-14 LAB — CUP PACEART REMOTE DEVICE CHECK
Battery Remaining Longevity: 90 mo
Battery Voltage: 2.99 V
Brady Statistic AP VP Percent: 14.09 %
Brady Statistic AP VS Percent: 0.01 %
Brady Statistic AS VP Percent: 85.69 %
Brady Statistic AS VS Percent: 0.21 %
Brady Statistic RA Percent Paced: 13.99 %
Brady Statistic RV Percent Paced: 99.78 %
Date Time Interrogation Session: 20231106182014
Implantable Lead Connection Status: 753985
Implantable Lead Connection Status: 753985
Implantable Lead Implant Date: 20210510
Implantable Lead Implant Date: 20210510
Implantable Lead Location: 753859
Implantable Lead Location: 753860
Implantable Lead Model: 3830
Implantable Lead Model: 5076
Implantable Pulse Generator Implant Date: 20210510
Lead Channel Impedance Value: 323 Ohm
Lead Channel Impedance Value: 342 Ohm
Lead Channel Impedance Value: 456 Ohm
Lead Channel Impedance Value: 456 Ohm
Lead Channel Pacing Threshold Amplitude: 0.625 V
Lead Channel Pacing Threshold Amplitude: 1.625 V
Lead Channel Pacing Threshold Pulse Width: 0.4 ms
Lead Channel Pacing Threshold Pulse Width: 0.4 ms
Lead Channel Sensing Intrinsic Amplitude: 2.875 mV
Lead Channel Sensing Intrinsic Amplitude: 2.875 mV
Lead Channel Sensing Intrinsic Amplitude: 6.25 mV
Lead Channel Sensing Intrinsic Amplitude: 6.25 mV
Lead Channel Setting Pacing Amplitude: 1.5 V
Lead Channel Setting Pacing Amplitude: 3.25 V
Lead Channel Setting Pacing Pulse Width: 0.4 ms
Lead Channel Setting Sensing Sensitivity: 1.2 mV
Zone Setting Status: 755011
Zone Setting Status: 755011

## 2022-02-21 DIAGNOSIS — F0283 Dementia in other diseases classified elsewhere, unspecified severity, with mood disturbance: Secondary | ICD-10-CM | POA: Diagnosis not present

## 2022-02-21 DIAGNOSIS — Z87440 Personal history of urinary (tract) infections: Secondary | ICD-10-CM | POA: Diagnosis not present

## 2022-02-21 DIAGNOSIS — N400 Enlarged prostate without lower urinary tract symptoms: Secondary | ICD-10-CM | POA: Diagnosis not present

## 2022-02-21 DIAGNOSIS — Z7401 Bed confinement status: Secondary | ICD-10-CM | POA: Diagnosis not present

## 2022-02-21 DIAGNOSIS — G311 Senile degeneration of brain, not elsewhere classified: Secondary | ICD-10-CM | POA: Diagnosis not present

## 2022-02-22 ENCOUNTER — Other Ambulatory Visit: Payer: Medicare Other | Admitting: Family Medicine

## 2022-02-28 ENCOUNTER — Other Ambulatory Visit (HOSPITAL_COMMUNITY): Payer: Self-pay

## 2022-02-28 DIAGNOSIS — N400 Enlarged prostate without lower urinary tract symptoms: Secondary | ICD-10-CM | POA: Diagnosis not present

## 2022-02-28 DIAGNOSIS — G311 Senile degeneration of brain, not elsewhere classified: Secondary | ICD-10-CM | POA: Diagnosis not present

## 2022-02-28 DIAGNOSIS — Z87440 Personal history of urinary (tract) infections: Secondary | ICD-10-CM | POA: Diagnosis not present

## 2022-02-28 DIAGNOSIS — F0283 Dementia in other diseases classified elsewhere, unspecified severity, with mood disturbance: Secondary | ICD-10-CM | POA: Diagnosis not present

## 2022-02-28 DIAGNOSIS — Z7401 Bed confinement status: Secondary | ICD-10-CM | POA: Diagnosis not present

## 2022-02-28 MED ORDER — FLUAD QUADRIVALENT 0.5 ML IM PRSY
PREFILLED_SYRINGE | INTRAMUSCULAR | 0 refills | Status: AC
  Filled 2022-02-28: qty 0.5, 1d supply, fill #0

## 2022-03-07 DIAGNOSIS — F0283 Dementia in other diseases classified elsewhere, unspecified severity, with mood disturbance: Secondary | ICD-10-CM | POA: Diagnosis not present

## 2022-03-07 DIAGNOSIS — Z87440 Personal history of urinary (tract) infections: Secondary | ICD-10-CM | POA: Diagnosis not present

## 2022-03-07 DIAGNOSIS — N400 Enlarged prostate without lower urinary tract symptoms: Secondary | ICD-10-CM | POA: Diagnosis not present

## 2022-03-07 DIAGNOSIS — G311 Senile degeneration of brain, not elsewhere classified: Secondary | ICD-10-CM | POA: Diagnosis not present

## 2022-03-07 DIAGNOSIS — Z7401 Bed confinement status: Secondary | ICD-10-CM | POA: Diagnosis not present

## 2022-03-10 DIAGNOSIS — N400 Enlarged prostate without lower urinary tract symptoms: Secondary | ICD-10-CM | POA: Diagnosis not present

## 2022-03-10 DIAGNOSIS — Z7401 Bed confinement status: Secondary | ICD-10-CM | POA: Diagnosis not present

## 2022-03-10 DIAGNOSIS — F0283 Dementia in other diseases classified elsewhere, unspecified severity, with mood disturbance: Secondary | ICD-10-CM | POA: Diagnosis not present

## 2022-03-10 DIAGNOSIS — Z87440 Personal history of urinary (tract) infections: Secondary | ICD-10-CM | POA: Diagnosis not present

## 2022-03-10 DIAGNOSIS — G311 Senile degeneration of brain, not elsewhere classified: Secondary | ICD-10-CM | POA: Diagnosis not present

## 2022-03-13 NOTE — Progress Notes (Signed)
Remote pacemaker transmission.   

## 2022-03-14 DIAGNOSIS — G311 Senile degeneration of brain, not elsewhere classified: Secondary | ICD-10-CM | POA: Diagnosis not present

## 2022-03-14 DIAGNOSIS — Z7401 Bed confinement status: Secondary | ICD-10-CM | POA: Diagnosis not present

## 2022-03-14 DIAGNOSIS — Z87440 Personal history of urinary (tract) infections: Secondary | ICD-10-CM | POA: Diagnosis not present

## 2022-03-14 DIAGNOSIS — F0283 Dementia in other diseases classified elsewhere, unspecified severity, with mood disturbance: Secondary | ICD-10-CM | POA: Diagnosis not present

## 2022-03-14 DIAGNOSIS — N400 Enlarged prostate without lower urinary tract symptoms: Secondary | ICD-10-CM | POA: Diagnosis not present

## 2022-03-21 DIAGNOSIS — F0283 Dementia in other diseases classified elsewhere, unspecified severity, with mood disturbance: Secondary | ICD-10-CM | POA: Diagnosis not present

## 2022-03-21 DIAGNOSIS — G311 Senile degeneration of brain, not elsewhere classified: Secondary | ICD-10-CM | POA: Diagnosis not present

## 2022-03-21 DIAGNOSIS — Z7401 Bed confinement status: Secondary | ICD-10-CM | POA: Diagnosis not present

## 2022-03-21 DIAGNOSIS — Z87440 Personal history of urinary (tract) infections: Secondary | ICD-10-CM | POA: Diagnosis not present

## 2022-03-21 DIAGNOSIS — N400 Enlarged prostate without lower urinary tract symptoms: Secondary | ICD-10-CM | POA: Diagnosis not present

## 2022-03-28 DIAGNOSIS — G311 Senile degeneration of brain, not elsewhere classified: Secondary | ICD-10-CM | POA: Diagnosis not present

## 2022-03-28 DIAGNOSIS — Z7401 Bed confinement status: Secondary | ICD-10-CM | POA: Diagnosis not present

## 2022-03-28 DIAGNOSIS — N400 Enlarged prostate without lower urinary tract symptoms: Secondary | ICD-10-CM | POA: Diagnosis not present

## 2022-03-28 DIAGNOSIS — Z87440 Personal history of urinary (tract) infections: Secondary | ICD-10-CM | POA: Diagnosis not present

## 2022-03-28 DIAGNOSIS — F0283 Dementia in other diseases classified elsewhere, unspecified severity, with mood disturbance: Secondary | ICD-10-CM | POA: Diagnosis not present

## 2022-04-06 DIAGNOSIS — N400 Enlarged prostate without lower urinary tract symptoms: Secondary | ICD-10-CM | POA: Diagnosis not present

## 2022-04-06 DIAGNOSIS — F0283 Dementia in other diseases classified elsewhere, unspecified severity, with mood disturbance: Secondary | ICD-10-CM | POA: Diagnosis not present

## 2022-04-06 DIAGNOSIS — Z7401 Bed confinement status: Secondary | ICD-10-CM | POA: Diagnosis not present

## 2022-04-06 DIAGNOSIS — G311 Senile degeneration of brain, not elsewhere classified: Secondary | ICD-10-CM | POA: Diagnosis not present

## 2022-04-06 DIAGNOSIS — Z87440 Personal history of urinary (tract) infections: Secondary | ICD-10-CM | POA: Diagnosis not present

## 2022-04-10 DIAGNOSIS — N400 Enlarged prostate without lower urinary tract symptoms: Secondary | ICD-10-CM | POA: Diagnosis not present

## 2022-04-10 DIAGNOSIS — G311 Senile degeneration of brain, not elsewhere classified: Secondary | ICD-10-CM | POA: Diagnosis not present

## 2022-04-10 DIAGNOSIS — F0283 Dementia in other diseases classified elsewhere, unspecified severity, with mood disturbance: Secondary | ICD-10-CM | POA: Diagnosis not present

## 2022-04-10 DIAGNOSIS — Z7401 Bed confinement status: Secondary | ICD-10-CM | POA: Diagnosis not present

## 2022-04-10 DIAGNOSIS — Z87440 Personal history of urinary (tract) infections: Secondary | ICD-10-CM | POA: Diagnosis not present

## 2022-04-13 DIAGNOSIS — F0283 Dementia in other diseases classified elsewhere, unspecified severity, with mood disturbance: Secondary | ICD-10-CM | POA: Diagnosis not present

## 2022-04-13 DIAGNOSIS — Z7401 Bed confinement status: Secondary | ICD-10-CM | POA: Diagnosis not present

## 2022-04-13 DIAGNOSIS — G311 Senile degeneration of brain, not elsewhere classified: Secondary | ICD-10-CM | POA: Diagnosis not present

## 2022-04-13 DIAGNOSIS — Z87440 Personal history of urinary (tract) infections: Secondary | ICD-10-CM | POA: Diagnosis not present

## 2022-04-13 DIAGNOSIS — N400 Enlarged prostate without lower urinary tract symptoms: Secondary | ICD-10-CM | POA: Diagnosis not present

## 2022-04-17 DIAGNOSIS — Z7401 Bed confinement status: Secondary | ICD-10-CM | POA: Diagnosis not present

## 2022-04-17 DIAGNOSIS — Z87440 Personal history of urinary (tract) infections: Secondary | ICD-10-CM | POA: Diagnosis not present

## 2022-04-17 DIAGNOSIS — F0283 Dementia in other diseases classified elsewhere, unspecified severity, with mood disturbance: Secondary | ICD-10-CM | POA: Diagnosis not present

## 2022-04-17 DIAGNOSIS — N400 Enlarged prostate without lower urinary tract symptoms: Secondary | ICD-10-CM | POA: Diagnosis not present

## 2022-04-17 DIAGNOSIS — G311 Senile degeneration of brain, not elsewhere classified: Secondary | ICD-10-CM | POA: Diagnosis not present

## 2022-04-19 DIAGNOSIS — G311 Senile degeneration of brain, not elsewhere classified: Secondary | ICD-10-CM | POA: Diagnosis not present

## 2022-04-19 DIAGNOSIS — F0283 Dementia in other diseases classified elsewhere, unspecified severity, with mood disturbance: Secondary | ICD-10-CM | POA: Diagnosis not present

## 2022-04-19 DIAGNOSIS — Z7401 Bed confinement status: Secondary | ICD-10-CM | POA: Diagnosis not present

## 2022-04-19 DIAGNOSIS — N400 Enlarged prostate without lower urinary tract symptoms: Secondary | ICD-10-CM | POA: Diagnosis not present

## 2022-04-19 DIAGNOSIS — Z87440 Personal history of urinary (tract) infections: Secondary | ICD-10-CM | POA: Diagnosis not present

## 2022-04-24 DIAGNOSIS — N400 Enlarged prostate without lower urinary tract symptoms: Secondary | ICD-10-CM | POA: Diagnosis not present

## 2022-04-24 DIAGNOSIS — G311 Senile degeneration of brain, not elsewhere classified: Secondary | ICD-10-CM | POA: Diagnosis not present

## 2022-04-24 DIAGNOSIS — F0283 Dementia in other diseases classified elsewhere, unspecified severity, with mood disturbance: Secondary | ICD-10-CM | POA: Diagnosis not present

## 2022-04-24 DIAGNOSIS — Z7401 Bed confinement status: Secondary | ICD-10-CM | POA: Diagnosis not present

## 2022-04-24 DIAGNOSIS — Z87440 Personal history of urinary (tract) infections: Secondary | ICD-10-CM | POA: Diagnosis not present

## 2022-04-27 DIAGNOSIS — Z7401 Bed confinement status: Secondary | ICD-10-CM | POA: Diagnosis not present

## 2022-04-27 DIAGNOSIS — G311 Senile degeneration of brain, not elsewhere classified: Secondary | ICD-10-CM | POA: Diagnosis not present

## 2022-04-27 DIAGNOSIS — Z87440 Personal history of urinary (tract) infections: Secondary | ICD-10-CM | POA: Diagnosis not present

## 2022-04-27 DIAGNOSIS — F0283 Dementia in other diseases classified elsewhere, unspecified severity, with mood disturbance: Secondary | ICD-10-CM | POA: Diagnosis not present

## 2022-04-27 DIAGNOSIS — N400 Enlarged prostate without lower urinary tract symptoms: Secondary | ICD-10-CM | POA: Diagnosis not present

## 2022-05-01 DIAGNOSIS — N400 Enlarged prostate without lower urinary tract symptoms: Secondary | ICD-10-CM | POA: Diagnosis not present

## 2022-05-01 DIAGNOSIS — G311 Senile degeneration of brain, not elsewhere classified: Secondary | ICD-10-CM | POA: Diagnosis not present

## 2022-05-01 DIAGNOSIS — Z7401 Bed confinement status: Secondary | ICD-10-CM | POA: Diagnosis not present

## 2022-05-01 DIAGNOSIS — F0283 Dementia in other diseases classified elsewhere, unspecified severity, with mood disturbance: Secondary | ICD-10-CM | POA: Diagnosis not present

## 2022-05-01 DIAGNOSIS — Z87440 Personal history of urinary (tract) infections: Secondary | ICD-10-CM | POA: Diagnosis not present

## 2022-05-06 DIAGNOSIS — Z7401 Bed confinement status: Secondary | ICD-10-CM | POA: Diagnosis not present

## 2022-05-06 DIAGNOSIS — G311 Senile degeneration of brain, not elsewhere classified: Secondary | ICD-10-CM | POA: Diagnosis not present

## 2022-05-06 DIAGNOSIS — Z87440 Personal history of urinary (tract) infections: Secondary | ICD-10-CM | POA: Diagnosis not present

## 2022-05-06 DIAGNOSIS — F0283 Dementia in other diseases classified elsewhere, unspecified severity, with mood disturbance: Secondary | ICD-10-CM | POA: Diagnosis not present

## 2022-05-06 DIAGNOSIS — N400 Enlarged prostate without lower urinary tract symptoms: Secondary | ICD-10-CM | POA: Diagnosis not present

## 2022-05-08 DIAGNOSIS — N400 Enlarged prostate without lower urinary tract symptoms: Secondary | ICD-10-CM | POA: Diagnosis not present

## 2022-05-08 DIAGNOSIS — F0283 Dementia in other diseases classified elsewhere, unspecified severity, with mood disturbance: Secondary | ICD-10-CM | POA: Diagnosis not present

## 2022-05-08 DIAGNOSIS — Z87440 Personal history of urinary (tract) infections: Secondary | ICD-10-CM | POA: Diagnosis not present

## 2022-05-08 DIAGNOSIS — G311 Senile degeneration of brain, not elsewhere classified: Secondary | ICD-10-CM | POA: Diagnosis not present

## 2022-05-08 DIAGNOSIS — Z7401 Bed confinement status: Secondary | ICD-10-CM | POA: Diagnosis not present

## 2022-05-11 DIAGNOSIS — N400 Enlarged prostate without lower urinary tract symptoms: Secondary | ICD-10-CM | POA: Diagnosis not present

## 2022-05-11 DIAGNOSIS — Z7401 Bed confinement status: Secondary | ICD-10-CM | POA: Diagnosis not present

## 2022-05-11 DIAGNOSIS — Z87440 Personal history of urinary (tract) infections: Secondary | ICD-10-CM | POA: Diagnosis not present

## 2022-05-11 DIAGNOSIS — F0283 Dementia in other diseases classified elsewhere, unspecified severity, with mood disturbance: Secondary | ICD-10-CM | POA: Diagnosis not present

## 2022-05-11 DIAGNOSIS — G311 Senile degeneration of brain, not elsewhere classified: Secondary | ICD-10-CM | POA: Diagnosis not present

## 2022-05-15 DIAGNOSIS — F0283 Dementia in other diseases classified elsewhere, unspecified severity, with mood disturbance: Secondary | ICD-10-CM | POA: Diagnosis not present

## 2022-05-15 DIAGNOSIS — N400 Enlarged prostate without lower urinary tract symptoms: Secondary | ICD-10-CM | POA: Diagnosis not present

## 2022-05-15 DIAGNOSIS — G311 Senile degeneration of brain, not elsewhere classified: Secondary | ICD-10-CM | POA: Diagnosis not present

## 2022-05-15 DIAGNOSIS — Z87440 Personal history of urinary (tract) infections: Secondary | ICD-10-CM | POA: Diagnosis not present

## 2022-05-15 DIAGNOSIS — Z7401 Bed confinement status: Secondary | ICD-10-CM | POA: Diagnosis not present

## 2022-05-16 ENCOUNTER — Ambulatory Visit: Payer: Medicare Other

## 2022-05-16 DIAGNOSIS — I441 Atrioventricular block, second degree: Secondary | ICD-10-CM | POA: Diagnosis not present

## 2022-05-16 LAB — CUP PACEART REMOTE DEVICE CHECK
Battery Remaining Longevity: 59 mo
Battery Voltage: 2.98 V
Brady Statistic AP VP Percent: 6.17 %
Brady Statistic AP VS Percent: 0.01 %
Brady Statistic AS VP Percent: 93.66 %
Brady Statistic AS VS Percent: 0.16 %
Brady Statistic RA Percent Paced: 6.17 %
Brady Statistic RV Percent Paced: 99.83 %
Date Time Interrogation Session: 20240205181200
Implantable Lead Connection Status: 753985
Implantable Lead Connection Status: 753985
Implantable Lead Implant Date: 20210510
Implantable Lead Implant Date: 20210510
Implantable Lead Location: 753859
Implantable Lead Location: 753860
Implantable Lead Model: 3830
Implantable Lead Model: 5076
Implantable Pulse Generator Implant Date: 20210510
Lead Channel Impedance Value: 323 Ohm
Lead Channel Impedance Value: 342 Ohm
Lead Channel Impedance Value: 437 Ohm
Lead Channel Impedance Value: 551 Ohm
Lead Channel Pacing Threshold Amplitude: 0.75 V
Lead Channel Pacing Threshold Amplitude: 1.5 V
Lead Channel Pacing Threshold Pulse Width: 0.4 ms
Lead Channel Pacing Threshold Pulse Width: 0.4 ms
Lead Channel Sensing Intrinsic Amplitude: 2.625 mV
Lead Channel Sensing Intrinsic Amplitude: 2.625 mV
Lead Channel Sensing Intrinsic Amplitude: 5.625 mV
Lead Channel Sensing Intrinsic Amplitude: 5.625 mV
Lead Channel Setting Pacing Amplitude: 1.5 V
Lead Channel Setting Pacing Amplitude: 4.25 V
Lead Channel Setting Pacing Pulse Width: 0.4 ms
Lead Channel Setting Sensing Sensitivity: 1.2 mV
Zone Setting Status: 755011
Zone Setting Status: 755011

## 2022-05-22 DIAGNOSIS — F0283 Dementia in other diseases classified elsewhere, unspecified severity, with mood disturbance: Secondary | ICD-10-CM | POA: Diagnosis not present

## 2022-05-22 DIAGNOSIS — G311 Senile degeneration of brain, not elsewhere classified: Secondary | ICD-10-CM | POA: Diagnosis not present

## 2022-05-22 DIAGNOSIS — Z87440 Personal history of urinary (tract) infections: Secondary | ICD-10-CM | POA: Diagnosis not present

## 2022-05-22 DIAGNOSIS — Z7401 Bed confinement status: Secondary | ICD-10-CM | POA: Diagnosis not present

## 2022-05-22 DIAGNOSIS — N400 Enlarged prostate without lower urinary tract symptoms: Secondary | ICD-10-CM | POA: Diagnosis not present

## 2022-05-23 DIAGNOSIS — Z87440 Personal history of urinary (tract) infections: Secondary | ICD-10-CM | POA: Diagnosis not present

## 2022-05-23 DIAGNOSIS — G311 Senile degeneration of brain, not elsewhere classified: Secondary | ICD-10-CM | POA: Diagnosis not present

## 2022-05-23 DIAGNOSIS — N400 Enlarged prostate without lower urinary tract symptoms: Secondary | ICD-10-CM | POA: Diagnosis not present

## 2022-05-23 DIAGNOSIS — F0283 Dementia in other diseases classified elsewhere, unspecified severity, with mood disturbance: Secondary | ICD-10-CM | POA: Diagnosis not present

## 2022-05-23 DIAGNOSIS — Z7401 Bed confinement status: Secondary | ICD-10-CM | POA: Diagnosis not present

## 2022-05-25 DIAGNOSIS — Z87440 Personal history of urinary (tract) infections: Secondary | ICD-10-CM | POA: Diagnosis not present

## 2022-05-25 DIAGNOSIS — F0283 Dementia in other diseases classified elsewhere, unspecified severity, with mood disturbance: Secondary | ICD-10-CM | POA: Diagnosis not present

## 2022-05-25 DIAGNOSIS — N400 Enlarged prostate without lower urinary tract symptoms: Secondary | ICD-10-CM | POA: Diagnosis not present

## 2022-05-25 DIAGNOSIS — G311 Senile degeneration of brain, not elsewhere classified: Secondary | ICD-10-CM | POA: Diagnosis not present

## 2022-05-25 DIAGNOSIS — Z7401 Bed confinement status: Secondary | ICD-10-CM | POA: Diagnosis not present

## 2022-05-29 DIAGNOSIS — Z87440 Personal history of urinary (tract) infections: Secondary | ICD-10-CM | POA: Diagnosis not present

## 2022-05-29 DIAGNOSIS — F0283 Dementia in other diseases classified elsewhere, unspecified severity, with mood disturbance: Secondary | ICD-10-CM | POA: Diagnosis not present

## 2022-05-29 DIAGNOSIS — Z7401 Bed confinement status: Secondary | ICD-10-CM | POA: Diagnosis not present

## 2022-05-29 DIAGNOSIS — G311 Senile degeneration of brain, not elsewhere classified: Secondary | ICD-10-CM | POA: Diagnosis not present

## 2022-05-29 DIAGNOSIS — N400 Enlarged prostate without lower urinary tract symptoms: Secondary | ICD-10-CM | POA: Diagnosis not present

## 2022-06-02 DIAGNOSIS — Z87440 Personal history of urinary (tract) infections: Secondary | ICD-10-CM | POA: Diagnosis not present

## 2022-06-02 DIAGNOSIS — Z7401 Bed confinement status: Secondary | ICD-10-CM | POA: Diagnosis not present

## 2022-06-02 DIAGNOSIS — G311 Senile degeneration of brain, not elsewhere classified: Secondary | ICD-10-CM | POA: Diagnosis not present

## 2022-06-02 DIAGNOSIS — F0283 Dementia in other diseases classified elsewhere, unspecified severity, with mood disturbance: Secondary | ICD-10-CM | POA: Diagnosis not present

## 2022-06-02 DIAGNOSIS — N400 Enlarged prostate without lower urinary tract symptoms: Secondary | ICD-10-CM | POA: Diagnosis not present

## 2022-06-06 DIAGNOSIS — N400 Enlarged prostate without lower urinary tract symptoms: Secondary | ICD-10-CM | POA: Diagnosis not present

## 2022-06-06 DIAGNOSIS — Z7401 Bed confinement status: Secondary | ICD-10-CM | POA: Diagnosis not present

## 2022-06-06 DIAGNOSIS — Z87440 Personal history of urinary (tract) infections: Secondary | ICD-10-CM | POA: Diagnosis not present

## 2022-06-06 DIAGNOSIS — G311 Senile degeneration of brain, not elsewhere classified: Secondary | ICD-10-CM | POA: Diagnosis not present

## 2022-06-06 DIAGNOSIS — F0283 Dementia in other diseases classified elsewhere, unspecified severity, with mood disturbance: Secondary | ICD-10-CM | POA: Diagnosis not present

## 2022-06-09 DIAGNOSIS — G311 Senile degeneration of brain, not elsewhere classified: Secondary | ICD-10-CM | POA: Diagnosis not present

## 2022-06-09 DIAGNOSIS — Z7401 Bed confinement status: Secondary | ICD-10-CM | POA: Diagnosis not present

## 2022-06-09 DIAGNOSIS — Z87440 Personal history of urinary (tract) infections: Secondary | ICD-10-CM | POA: Diagnosis not present

## 2022-06-09 DIAGNOSIS — N400 Enlarged prostate without lower urinary tract symptoms: Secondary | ICD-10-CM | POA: Diagnosis not present

## 2022-06-09 DIAGNOSIS — F0283 Dementia in other diseases classified elsewhere, unspecified severity, with mood disturbance: Secondary | ICD-10-CM | POA: Diagnosis not present

## 2022-06-12 DIAGNOSIS — F0283 Dementia in other diseases classified elsewhere, unspecified severity, with mood disturbance: Secondary | ICD-10-CM | POA: Diagnosis not present

## 2022-06-12 DIAGNOSIS — Z7401 Bed confinement status: Secondary | ICD-10-CM | POA: Diagnosis not present

## 2022-06-12 DIAGNOSIS — Z87440 Personal history of urinary (tract) infections: Secondary | ICD-10-CM | POA: Diagnosis not present

## 2022-06-12 DIAGNOSIS — N400 Enlarged prostate without lower urinary tract symptoms: Secondary | ICD-10-CM | POA: Diagnosis not present

## 2022-06-12 DIAGNOSIS — G311 Senile degeneration of brain, not elsewhere classified: Secondary | ICD-10-CM | POA: Diagnosis not present

## 2022-06-19 DIAGNOSIS — N400 Enlarged prostate without lower urinary tract symptoms: Secondary | ICD-10-CM | POA: Diagnosis not present

## 2022-06-19 DIAGNOSIS — F0283 Dementia in other diseases classified elsewhere, unspecified severity, with mood disturbance: Secondary | ICD-10-CM | POA: Diagnosis not present

## 2022-06-19 DIAGNOSIS — Z87440 Personal history of urinary (tract) infections: Secondary | ICD-10-CM | POA: Diagnosis not present

## 2022-06-19 DIAGNOSIS — G311 Senile degeneration of brain, not elsewhere classified: Secondary | ICD-10-CM | POA: Diagnosis not present

## 2022-06-19 DIAGNOSIS — Z7401 Bed confinement status: Secondary | ICD-10-CM | POA: Diagnosis not present

## 2022-06-20 NOTE — Progress Notes (Signed)
Remote pacemaker transmission.   

## 2022-06-23 DIAGNOSIS — N400 Enlarged prostate without lower urinary tract symptoms: Secondary | ICD-10-CM | POA: Diagnosis not present

## 2022-06-23 DIAGNOSIS — Z7401 Bed confinement status: Secondary | ICD-10-CM | POA: Diagnosis not present

## 2022-06-23 DIAGNOSIS — G311 Senile degeneration of brain, not elsewhere classified: Secondary | ICD-10-CM | POA: Diagnosis not present

## 2022-06-23 DIAGNOSIS — Z87440 Personal history of urinary (tract) infections: Secondary | ICD-10-CM | POA: Diagnosis not present

## 2022-06-23 DIAGNOSIS — F0283 Dementia in other diseases classified elsewhere, unspecified severity, with mood disturbance: Secondary | ICD-10-CM | POA: Diagnosis not present

## 2022-06-24 DIAGNOSIS — N400 Enlarged prostate without lower urinary tract symptoms: Secondary | ICD-10-CM | POA: Diagnosis not present

## 2022-06-24 DIAGNOSIS — Z87440 Personal history of urinary (tract) infections: Secondary | ICD-10-CM | POA: Diagnosis not present

## 2022-06-24 DIAGNOSIS — Z7401 Bed confinement status: Secondary | ICD-10-CM | POA: Diagnosis not present

## 2022-06-24 DIAGNOSIS — F0283 Dementia in other diseases classified elsewhere, unspecified severity, with mood disturbance: Secondary | ICD-10-CM | POA: Diagnosis not present

## 2022-06-24 DIAGNOSIS — G311 Senile degeneration of brain, not elsewhere classified: Secondary | ICD-10-CM | POA: Diagnosis not present

## 2022-06-26 DIAGNOSIS — Z7401 Bed confinement status: Secondary | ICD-10-CM | POA: Diagnosis not present

## 2022-06-26 DIAGNOSIS — G311 Senile degeneration of brain, not elsewhere classified: Secondary | ICD-10-CM | POA: Diagnosis not present

## 2022-06-26 DIAGNOSIS — N400 Enlarged prostate without lower urinary tract symptoms: Secondary | ICD-10-CM | POA: Diagnosis not present

## 2022-06-26 DIAGNOSIS — F0283 Dementia in other diseases classified elsewhere, unspecified severity, with mood disturbance: Secondary | ICD-10-CM | POA: Diagnosis not present

## 2022-06-26 DIAGNOSIS — Z87440 Personal history of urinary (tract) infections: Secondary | ICD-10-CM | POA: Diagnosis not present

## 2022-06-29 DIAGNOSIS — F0283 Dementia in other diseases classified elsewhere, unspecified severity, with mood disturbance: Secondary | ICD-10-CM | POA: Diagnosis not present

## 2022-06-29 DIAGNOSIS — Z87440 Personal history of urinary (tract) infections: Secondary | ICD-10-CM | POA: Diagnosis not present

## 2022-06-29 DIAGNOSIS — Z7401 Bed confinement status: Secondary | ICD-10-CM | POA: Diagnosis not present

## 2022-06-29 DIAGNOSIS — N400 Enlarged prostate without lower urinary tract symptoms: Secondary | ICD-10-CM | POA: Diagnosis not present

## 2022-06-29 DIAGNOSIS — G311 Senile degeneration of brain, not elsewhere classified: Secondary | ICD-10-CM | POA: Diagnosis not present

## 2022-07-04 DIAGNOSIS — N400 Enlarged prostate without lower urinary tract symptoms: Secondary | ICD-10-CM | POA: Diagnosis not present

## 2022-07-04 DIAGNOSIS — F0283 Dementia in other diseases classified elsewhere, unspecified severity, with mood disturbance: Secondary | ICD-10-CM | POA: Diagnosis not present

## 2022-07-04 DIAGNOSIS — Z87440 Personal history of urinary (tract) infections: Secondary | ICD-10-CM | POA: Diagnosis not present

## 2022-07-04 DIAGNOSIS — Z7401 Bed confinement status: Secondary | ICD-10-CM | POA: Diagnosis not present

## 2022-07-04 DIAGNOSIS — G311 Senile degeneration of brain, not elsewhere classified: Secondary | ICD-10-CM | POA: Diagnosis not present

## 2022-07-10 DIAGNOSIS — R159 Full incontinence of feces: Secondary | ICD-10-CM | POA: Diagnosis not present

## 2022-07-10 DIAGNOSIS — Z7401 Bed confinement status: Secondary | ICD-10-CM | POA: Diagnosis not present

## 2022-07-10 DIAGNOSIS — F0283 Dementia in other diseases classified elsewhere, unspecified severity, with mood disturbance: Secondary | ICD-10-CM | POA: Diagnosis not present

## 2022-07-10 DIAGNOSIS — Z87440 Personal history of urinary (tract) infections: Secondary | ICD-10-CM | POA: Diagnosis not present

## 2022-07-10 DIAGNOSIS — G311 Senile degeneration of brain, not elsewhere classified: Secondary | ICD-10-CM | POA: Diagnosis not present

## 2022-07-10 DIAGNOSIS — N400 Enlarged prostate without lower urinary tract symptoms: Secondary | ICD-10-CM | POA: Diagnosis not present

## 2022-07-10 DIAGNOSIS — Z741 Need for assistance with personal care: Secondary | ICD-10-CM | POA: Diagnosis not present

## 2022-07-10 DIAGNOSIS — R32 Unspecified urinary incontinence: Secondary | ICD-10-CM | POA: Diagnosis not present

## 2022-07-11 DIAGNOSIS — R159 Full incontinence of feces: Secondary | ICD-10-CM | POA: Diagnosis not present

## 2022-07-11 DIAGNOSIS — R32 Unspecified urinary incontinence: Secondary | ICD-10-CM | POA: Diagnosis not present

## 2022-07-11 DIAGNOSIS — G311 Senile degeneration of brain, not elsewhere classified: Secondary | ICD-10-CM | POA: Diagnosis not present

## 2022-07-11 DIAGNOSIS — N400 Enlarged prostate without lower urinary tract symptoms: Secondary | ICD-10-CM | POA: Diagnosis not present

## 2022-07-11 DIAGNOSIS — F0283 Dementia in other diseases classified elsewhere, unspecified severity, with mood disturbance: Secondary | ICD-10-CM | POA: Diagnosis not present

## 2022-07-11 DIAGNOSIS — Z7401 Bed confinement status: Secondary | ICD-10-CM | POA: Diagnosis not present

## 2022-07-18 DIAGNOSIS — R32 Unspecified urinary incontinence: Secondary | ICD-10-CM | POA: Diagnosis not present

## 2022-07-18 DIAGNOSIS — R159 Full incontinence of feces: Secondary | ICD-10-CM | POA: Diagnosis not present

## 2022-07-18 DIAGNOSIS — G311 Senile degeneration of brain, not elsewhere classified: Secondary | ICD-10-CM | POA: Diagnosis not present

## 2022-07-18 DIAGNOSIS — F0283 Dementia in other diseases classified elsewhere, unspecified severity, with mood disturbance: Secondary | ICD-10-CM | POA: Diagnosis not present

## 2022-07-18 DIAGNOSIS — Z7401 Bed confinement status: Secondary | ICD-10-CM | POA: Diagnosis not present

## 2022-07-18 DIAGNOSIS — N400 Enlarged prostate without lower urinary tract symptoms: Secondary | ICD-10-CM | POA: Diagnosis not present

## 2022-07-25 DIAGNOSIS — G311 Senile degeneration of brain, not elsewhere classified: Secondary | ICD-10-CM | POA: Diagnosis not present

## 2022-07-25 DIAGNOSIS — Z7401 Bed confinement status: Secondary | ICD-10-CM | POA: Diagnosis not present

## 2022-07-25 DIAGNOSIS — N400 Enlarged prostate without lower urinary tract symptoms: Secondary | ICD-10-CM | POA: Diagnosis not present

## 2022-07-25 DIAGNOSIS — R32 Unspecified urinary incontinence: Secondary | ICD-10-CM | POA: Diagnosis not present

## 2022-07-25 DIAGNOSIS — F0283 Dementia in other diseases classified elsewhere, unspecified severity, with mood disturbance: Secondary | ICD-10-CM | POA: Diagnosis not present

## 2022-07-25 DIAGNOSIS — R159 Full incontinence of feces: Secondary | ICD-10-CM | POA: Diagnosis not present

## 2022-07-28 DIAGNOSIS — N400 Enlarged prostate without lower urinary tract symptoms: Secondary | ICD-10-CM | POA: Diagnosis not present

## 2022-07-28 DIAGNOSIS — R32 Unspecified urinary incontinence: Secondary | ICD-10-CM | POA: Diagnosis not present

## 2022-07-28 DIAGNOSIS — F0283 Dementia in other diseases classified elsewhere, unspecified severity, with mood disturbance: Secondary | ICD-10-CM | POA: Diagnosis not present

## 2022-07-28 DIAGNOSIS — Z7401 Bed confinement status: Secondary | ICD-10-CM | POA: Diagnosis not present

## 2022-07-28 DIAGNOSIS — R159 Full incontinence of feces: Secondary | ICD-10-CM | POA: Diagnosis not present

## 2022-07-28 DIAGNOSIS — G311 Senile degeneration of brain, not elsewhere classified: Secondary | ICD-10-CM | POA: Diagnosis not present

## 2022-08-01 DIAGNOSIS — R32 Unspecified urinary incontinence: Secondary | ICD-10-CM | POA: Diagnosis not present

## 2022-08-01 DIAGNOSIS — R159 Full incontinence of feces: Secondary | ICD-10-CM | POA: Diagnosis not present

## 2022-08-01 DIAGNOSIS — G311 Senile degeneration of brain, not elsewhere classified: Secondary | ICD-10-CM | POA: Diagnosis not present

## 2022-08-01 DIAGNOSIS — F0283 Dementia in other diseases classified elsewhere, unspecified severity, with mood disturbance: Secondary | ICD-10-CM | POA: Diagnosis not present

## 2022-08-01 DIAGNOSIS — Z7401 Bed confinement status: Secondary | ICD-10-CM | POA: Diagnosis not present

## 2022-08-01 DIAGNOSIS — N400 Enlarged prostate without lower urinary tract symptoms: Secondary | ICD-10-CM | POA: Diagnosis not present

## 2022-08-08 DIAGNOSIS — G311 Senile degeneration of brain, not elsewhere classified: Secondary | ICD-10-CM | POA: Diagnosis not present

## 2022-08-08 DIAGNOSIS — F0283 Dementia in other diseases classified elsewhere, unspecified severity, with mood disturbance: Secondary | ICD-10-CM | POA: Diagnosis not present

## 2022-08-08 DIAGNOSIS — R32 Unspecified urinary incontinence: Secondary | ICD-10-CM | POA: Diagnosis not present

## 2022-08-08 DIAGNOSIS — R159 Full incontinence of feces: Secondary | ICD-10-CM | POA: Diagnosis not present

## 2022-08-08 DIAGNOSIS — N400 Enlarged prostate without lower urinary tract symptoms: Secondary | ICD-10-CM | POA: Diagnosis not present

## 2022-08-08 DIAGNOSIS — Z7401 Bed confinement status: Secondary | ICD-10-CM | POA: Diagnosis not present

## 2022-08-09 DIAGNOSIS — Z741 Need for assistance with personal care: Secondary | ICD-10-CM | POA: Diagnosis not present

## 2022-08-09 DIAGNOSIS — R32 Unspecified urinary incontinence: Secondary | ICD-10-CM | POA: Diagnosis not present

## 2022-08-09 DIAGNOSIS — Z7401 Bed confinement status: Secondary | ICD-10-CM | POA: Diagnosis not present

## 2022-08-09 DIAGNOSIS — Z87440 Personal history of urinary (tract) infections: Secondary | ICD-10-CM | POA: Diagnosis not present

## 2022-08-09 DIAGNOSIS — R159 Full incontinence of feces: Secondary | ICD-10-CM | POA: Diagnosis not present

## 2022-08-09 DIAGNOSIS — N400 Enlarged prostate without lower urinary tract symptoms: Secondary | ICD-10-CM | POA: Diagnosis not present

## 2022-08-09 DIAGNOSIS — G311 Senile degeneration of brain, not elsewhere classified: Secondary | ICD-10-CM | POA: Diagnosis not present

## 2022-08-09 DIAGNOSIS — F0283 Dementia in other diseases classified elsewhere, unspecified severity, with mood disturbance: Secondary | ICD-10-CM | POA: Diagnosis not present

## 2022-08-10 DIAGNOSIS — F0283 Dementia in other diseases classified elsewhere, unspecified severity, with mood disturbance: Secondary | ICD-10-CM | POA: Diagnosis not present

## 2022-08-10 DIAGNOSIS — N400 Enlarged prostate without lower urinary tract symptoms: Secondary | ICD-10-CM | POA: Diagnosis not present

## 2022-08-10 DIAGNOSIS — Z7401 Bed confinement status: Secondary | ICD-10-CM | POA: Diagnosis not present

## 2022-08-10 DIAGNOSIS — G311 Senile degeneration of brain, not elsewhere classified: Secondary | ICD-10-CM | POA: Diagnosis not present

## 2022-08-10 DIAGNOSIS — R159 Full incontinence of feces: Secondary | ICD-10-CM | POA: Diagnosis not present

## 2022-08-10 DIAGNOSIS — R32 Unspecified urinary incontinence: Secondary | ICD-10-CM | POA: Diagnosis not present

## 2022-08-15 ENCOUNTER — Ambulatory Visit (INDEPENDENT_AMBULATORY_CARE_PROVIDER_SITE_OTHER): Payer: Medicare Other

## 2022-08-15 DIAGNOSIS — I441 Atrioventricular block, second degree: Secondary | ICD-10-CM | POA: Diagnosis not present

## 2022-08-15 LAB — CUP PACEART REMOTE DEVICE CHECK
Battery Remaining Longevity: 61 mo
Battery Voltage: 2.98 V
Brady Statistic AP VP Percent: 13.75 %
Brady Statistic AP VS Percent: 0 %
Brady Statistic AS VP Percent: 85.61 %
Brady Statistic AS VS Percent: 0.64 %
Brady Statistic RA Percent Paced: 13.91 %
Brady Statistic RV Percent Paced: 99.35 %
Date Time Interrogation Session: 20240506212803
Implantable Lead Connection Status: 753985
Implantable Lead Connection Status: 753985
Implantable Lead Implant Date: 20210510
Implantable Lead Implant Date: 20210510
Implantable Lead Location: 753859
Implantable Lead Location: 753860
Implantable Lead Model: 3830
Implantable Lead Model: 5076
Implantable Pulse Generator Implant Date: 20210510
Lead Channel Impedance Value: 304 Ohm
Lead Channel Impedance Value: 323 Ohm
Lead Channel Impedance Value: 418 Ohm
Lead Channel Impedance Value: 456 Ohm
Lead Channel Pacing Threshold Amplitude: 0.625 V
Lead Channel Pacing Threshold Amplitude: 1.625 V
Lead Channel Pacing Threshold Pulse Width: 0.4 ms
Lead Channel Pacing Threshold Pulse Width: 0.4 ms
Lead Channel Sensing Intrinsic Amplitude: 2.25 mV
Lead Channel Sensing Intrinsic Amplitude: 2.25 mV
Lead Channel Sensing Intrinsic Amplitude: 5.625 mV
Lead Channel Sensing Intrinsic Amplitude: 5.625 mV
Lead Channel Setting Pacing Amplitude: 1.5 V
Lead Channel Setting Pacing Amplitude: 4 V
Lead Channel Setting Pacing Pulse Width: 0.4 ms
Lead Channel Setting Sensing Sensitivity: 1.2 mV
Zone Setting Status: 755011
Zone Setting Status: 755011

## 2022-08-17 DIAGNOSIS — R159 Full incontinence of feces: Secondary | ICD-10-CM | POA: Diagnosis not present

## 2022-08-17 DIAGNOSIS — R32 Unspecified urinary incontinence: Secondary | ICD-10-CM | POA: Diagnosis not present

## 2022-08-17 DIAGNOSIS — Z7401 Bed confinement status: Secondary | ICD-10-CM | POA: Diagnosis not present

## 2022-08-17 DIAGNOSIS — N400 Enlarged prostate without lower urinary tract symptoms: Secondary | ICD-10-CM | POA: Diagnosis not present

## 2022-08-17 DIAGNOSIS — F0283 Dementia in other diseases classified elsewhere, unspecified severity, with mood disturbance: Secondary | ICD-10-CM | POA: Diagnosis not present

## 2022-08-17 DIAGNOSIS — G311 Senile degeneration of brain, not elsewhere classified: Secondary | ICD-10-CM | POA: Diagnosis not present

## 2022-08-22 DIAGNOSIS — G311 Senile degeneration of brain, not elsewhere classified: Secondary | ICD-10-CM | POA: Diagnosis not present

## 2022-08-22 DIAGNOSIS — F0283 Dementia in other diseases classified elsewhere, unspecified severity, with mood disturbance: Secondary | ICD-10-CM | POA: Diagnosis not present

## 2022-08-22 DIAGNOSIS — N400 Enlarged prostate without lower urinary tract symptoms: Secondary | ICD-10-CM | POA: Diagnosis not present

## 2022-08-22 DIAGNOSIS — R159 Full incontinence of feces: Secondary | ICD-10-CM | POA: Diagnosis not present

## 2022-08-22 DIAGNOSIS — Z7401 Bed confinement status: Secondary | ICD-10-CM | POA: Diagnosis not present

## 2022-08-22 DIAGNOSIS — R32 Unspecified urinary incontinence: Secondary | ICD-10-CM | POA: Diagnosis not present

## 2022-08-29 DIAGNOSIS — G311 Senile degeneration of brain, not elsewhere classified: Secondary | ICD-10-CM | POA: Diagnosis not present

## 2022-08-29 DIAGNOSIS — N400 Enlarged prostate without lower urinary tract symptoms: Secondary | ICD-10-CM | POA: Diagnosis not present

## 2022-08-29 DIAGNOSIS — F0283 Dementia in other diseases classified elsewhere, unspecified severity, with mood disturbance: Secondary | ICD-10-CM | POA: Diagnosis not present

## 2022-08-29 DIAGNOSIS — Z7401 Bed confinement status: Secondary | ICD-10-CM | POA: Diagnosis not present

## 2022-08-29 DIAGNOSIS — R32 Unspecified urinary incontinence: Secondary | ICD-10-CM | POA: Diagnosis not present

## 2022-08-29 DIAGNOSIS — R159 Full incontinence of feces: Secondary | ICD-10-CM | POA: Diagnosis not present

## 2022-09-05 DIAGNOSIS — G311 Senile degeneration of brain, not elsewhere classified: Secondary | ICD-10-CM | POA: Diagnosis not present

## 2022-09-05 DIAGNOSIS — Z7401 Bed confinement status: Secondary | ICD-10-CM | POA: Diagnosis not present

## 2022-09-05 DIAGNOSIS — F0283 Dementia in other diseases classified elsewhere, unspecified severity, with mood disturbance: Secondary | ICD-10-CM | POA: Diagnosis not present

## 2022-09-05 DIAGNOSIS — R32 Unspecified urinary incontinence: Secondary | ICD-10-CM | POA: Diagnosis not present

## 2022-09-05 DIAGNOSIS — N400 Enlarged prostate without lower urinary tract symptoms: Secondary | ICD-10-CM | POA: Diagnosis not present

## 2022-09-05 DIAGNOSIS — R159 Full incontinence of feces: Secondary | ICD-10-CM | POA: Diagnosis not present

## 2022-09-06 NOTE — Progress Notes (Signed)
Remote pacemaker transmission.   

## 2022-09-09 DIAGNOSIS — G311 Senile degeneration of brain, not elsewhere classified: Secondary | ICD-10-CM | POA: Diagnosis not present

## 2022-09-09 DIAGNOSIS — Z87440 Personal history of urinary (tract) infections: Secondary | ICD-10-CM | POA: Diagnosis not present

## 2022-09-09 DIAGNOSIS — Z7401 Bed confinement status: Secondary | ICD-10-CM | POA: Diagnosis not present

## 2022-09-09 DIAGNOSIS — R32 Unspecified urinary incontinence: Secondary | ICD-10-CM | POA: Diagnosis not present

## 2022-09-09 DIAGNOSIS — F02811 Dementia in other diseases classified elsewhere, unspecified severity, with agitation: Secondary | ICD-10-CM | POA: Diagnosis not present

## 2022-09-09 DIAGNOSIS — Z741 Need for assistance with personal care: Secondary | ICD-10-CM | POA: Diagnosis not present

## 2022-09-09 DIAGNOSIS — F0283 Dementia in other diseases classified elsewhere, unspecified severity, with mood disturbance: Secondary | ICD-10-CM | POA: Diagnosis not present

## 2022-09-09 DIAGNOSIS — R159 Full incontinence of feces: Secondary | ICD-10-CM | POA: Diagnosis not present

## 2022-09-09 DIAGNOSIS — N400 Enlarged prostate without lower urinary tract symptoms: Secondary | ICD-10-CM | POA: Diagnosis not present

## 2022-09-12 DIAGNOSIS — G311 Senile degeneration of brain, not elsewhere classified: Secondary | ICD-10-CM | POA: Diagnosis not present

## 2022-09-12 DIAGNOSIS — R159 Full incontinence of feces: Secondary | ICD-10-CM | POA: Diagnosis not present

## 2022-09-12 DIAGNOSIS — R32 Unspecified urinary incontinence: Secondary | ICD-10-CM | POA: Diagnosis not present

## 2022-09-12 DIAGNOSIS — F02811 Dementia in other diseases classified elsewhere, unspecified severity, with agitation: Secondary | ICD-10-CM | POA: Diagnosis not present

## 2022-09-12 DIAGNOSIS — N400 Enlarged prostate without lower urinary tract symptoms: Secondary | ICD-10-CM | POA: Diagnosis not present

## 2022-09-12 DIAGNOSIS — F0283 Dementia in other diseases classified elsewhere, unspecified severity, with mood disturbance: Secondary | ICD-10-CM | POA: Diagnosis not present

## 2022-09-19 DIAGNOSIS — R32 Unspecified urinary incontinence: Secondary | ICD-10-CM | POA: Diagnosis not present

## 2022-09-19 DIAGNOSIS — F0283 Dementia in other diseases classified elsewhere, unspecified severity, with mood disturbance: Secondary | ICD-10-CM | POA: Diagnosis not present

## 2022-09-19 DIAGNOSIS — G311 Senile degeneration of brain, not elsewhere classified: Secondary | ICD-10-CM | POA: Diagnosis not present

## 2022-09-19 DIAGNOSIS — F02811 Dementia in other diseases classified elsewhere, unspecified severity, with agitation: Secondary | ICD-10-CM | POA: Diagnosis not present

## 2022-09-19 DIAGNOSIS — N400 Enlarged prostate without lower urinary tract symptoms: Secondary | ICD-10-CM | POA: Diagnosis not present

## 2022-09-19 DIAGNOSIS — R159 Full incontinence of feces: Secondary | ICD-10-CM | POA: Diagnosis not present

## 2022-09-26 DIAGNOSIS — F02811 Dementia in other diseases classified elsewhere, unspecified severity, with agitation: Secondary | ICD-10-CM | POA: Diagnosis not present

## 2022-09-26 DIAGNOSIS — N400 Enlarged prostate without lower urinary tract symptoms: Secondary | ICD-10-CM | POA: Diagnosis not present

## 2022-09-26 DIAGNOSIS — G311 Senile degeneration of brain, not elsewhere classified: Secondary | ICD-10-CM | POA: Diagnosis not present

## 2022-09-26 DIAGNOSIS — F0283 Dementia in other diseases classified elsewhere, unspecified severity, with mood disturbance: Secondary | ICD-10-CM | POA: Diagnosis not present

## 2022-09-26 DIAGNOSIS — R159 Full incontinence of feces: Secondary | ICD-10-CM | POA: Diagnosis not present

## 2022-09-26 DIAGNOSIS — R32 Unspecified urinary incontinence: Secondary | ICD-10-CM | POA: Diagnosis not present

## 2022-10-03 DIAGNOSIS — G311 Senile degeneration of brain, not elsewhere classified: Secondary | ICD-10-CM | POA: Diagnosis not present

## 2022-10-03 DIAGNOSIS — R32 Unspecified urinary incontinence: Secondary | ICD-10-CM | POA: Diagnosis not present

## 2022-10-03 DIAGNOSIS — F0283 Dementia in other diseases classified elsewhere, unspecified severity, with mood disturbance: Secondary | ICD-10-CM | POA: Diagnosis not present

## 2022-10-03 DIAGNOSIS — R159 Full incontinence of feces: Secondary | ICD-10-CM | POA: Diagnosis not present

## 2022-10-03 DIAGNOSIS — N400 Enlarged prostate without lower urinary tract symptoms: Secondary | ICD-10-CM | POA: Diagnosis not present

## 2022-10-03 DIAGNOSIS — F02811 Dementia in other diseases classified elsewhere, unspecified severity, with agitation: Secondary | ICD-10-CM | POA: Diagnosis not present

## 2022-10-09 DIAGNOSIS — G311 Senile degeneration of brain, not elsewhere classified: Secondary | ICD-10-CM | POA: Diagnosis not present

## 2022-10-09 DIAGNOSIS — N400 Enlarged prostate without lower urinary tract symptoms: Secondary | ICD-10-CM | POA: Diagnosis not present

## 2022-10-09 DIAGNOSIS — F0283 Dementia in other diseases classified elsewhere, unspecified severity, with mood disturbance: Secondary | ICD-10-CM | POA: Diagnosis not present

## 2022-10-09 DIAGNOSIS — R32 Unspecified urinary incontinence: Secondary | ICD-10-CM | POA: Diagnosis not present

## 2022-10-09 DIAGNOSIS — F02811 Dementia in other diseases classified elsewhere, unspecified severity, with agitation: Secondary | ICD-10-CM | POA: Diagnosis not present

## 2022-10-09 DIAGNOSIS — Z87440 Personal history of urinary (tract) infections: Secondary | ICD-10-CM | POA: Diagnosis not present

## 2022-10-09 DIAGNOSIS — Z741 Need for assistance with personal care: Secondary | ICD-10-CM | POA: Diagnosis not present

## 2022-10-09 DIAGNOSIS — Z7401 Bed confinement status: Secondary | ICD-10-CM | POA: Diagnosis not present

## 2022-10-09 DIAGNOSIS — R159 Full incontinence of feces: Secondary | ICD-10-CM | POA: Diagnosis not present

## 2022-10-10 DIAGNOSIS — R159 Full incontinence of feces: Secondary | ICD-10-CM | POA: Diagnosis not present

## 2022-10-10 DIAGNOSIS — F0283 Dementia in other diseases classified elsewhere, unspecified severity, with mood disturbance: Secondary | ICD-10-CM | POA: Diagnosis not present

## 2022-10-10 DIAGNOSIS — N400 Enlarged prostate without lower urinary tract symptoms: Secondary | ICD-10-CM | POA: Diagnosis not present

## 2022-10-10 DIAGNOSIS — G311 Senile degeneration of brain, not elsewhere classified: Secondary | ICD-10-CM | POA: Diagnosis not present

## 2022-10-10 DIAGNOSIS — F02811 Dementia in other diseases classified elsewhere, unspecified severity, with agitation: Secondary | ICD-10-CM | POA: Diagnosis not present

## 2022-10-10 DIAGNOSIS — R32 Unspecified urinary incontinence: Secondary | ICD-10-CM | POA: Diagnosis not present

## 2022-10-17 DIAGNOSIS — F02811 Dementia in other diseases classified elsewhere, unspecified severity, with agitation: Secondary | ICD-10-CM | POA: Diagnosis not present

## 2022-10-17 DIAGNOSIS — N400 Enlarged prostate without lower urinary tract symptoms: Secondary | ICD-10-CM | POA: Diagnosis not present

## 2022-10-17 DIAGNOSIS — R159 Full incontinence of feces: Secondary | ICD-10-CM | POA: Diagnosis not present

## 2022-10-17 DIAGNOSIS — G311 Senile degeneration of brain, not elsewhere classified: Secondary | ICD-10-CM | POA: Diagnosis not present

## 2022-10-17 DIAGNOSIS — F0283 Dementia in other diseases classified elsewhere, unspecified severity, with mood disturbance: Secondary | ICD-10-CM | POA: Diagnosis not present

## 2022-10-17 DIAGNOSIS — R32 Unspecified urinary incontinence: Secondary | ICD-10-CM | POA: Diagnosis not present

## 2022-10-24 DIAGNOSIS — F0283 Dementia in other diseases classified elsewhere, unspecified severity, with mood disturbance: Secondary | ICD-10-CM | POA: Diagnosis not present

## 2022-10-24 DIAGNOSIS — N400 Enlarged prostate without lower urinary tract symptoms: Secondary | ICD-10-CM | POA: Diagnosis not present

## 2022-10-24 DIAGNOSIS — F02811 Dementia in other diseases classified elsewhere, unspecified severity, with agitation: Secondary | ICD-10-CM | POA: Diagnosis not present

## 2022-10-24 DIAGNOSIS — R32 Unspecified urinary incontinence: Secondary | ICD-10-CM | POA: Diagnosis not present

## 2022-10-24 DIAGNOSIS — G311 Senile degeneration of brain, not elsewhere classified: Secondary | ICD-10-CM | POA: Diagnosis not present

## 2022-10-24 DIAGNOSIS — R159 Full incontinence of feces: Secondary | ICD-10-CM | POA: Diagnosis not present

## 2022-10-31 DIAGNOSIS — F02811 Dementia in other diseases classified elsewhere, unspecified severity, with agitation: Secondary | ICD-10-CM | POA: Diagnosis not present

## 2022-10-31 DIAGNOSIS — G311 Senile degeneration of brain, not elsewhere classified: Secondary | ICD-10-CM | POA: Diagnosis not present

## 2022-10-31 DIAGNOSIS — F0283 Dementia in other diseases classified elsewhere, unspecified severity, with mood disturbance: Secondary | ICD-10-CM | POA: Diagnosis not present

## 2022-10-31 DIAGNOSIS — R159 Full incontinence of feces: Secondary | ICD-10-CM | POA: Diagnosis not present

## 2022-10-31 DIAGNOSIS — N400 Enlarged prostate without lower urinary tract symptoms: Secondary | ICD-10-CM | POA: Diagnosis not present

## 2022-10-31 DIAGNOSIS — R32 Unspecified urinary incontinence: Secondary | ICD-10-CM | POA: Diagnosis not present

## 2022-11-07 DIAGNOSIS — F0283 Dementia in other diseases classified elsewhere, unspecified severity, with mood disturbance: Secondary | ICD-10-CM | POA: Diagnosis not present

## 2022-11-07 DIAGNOSIS — R159 Full incontinence of feces: Secondary | ICD-10-CM | POA: Diagnosis not present

## 2022-11-07 DIAGNOSIS — N400 Enlarged prostate without lower urinary tract symptoms: Secondary | ICD-10-CM | POA: Diagnosis not present

## 2022-11-07 DIAGNOSIS — F02811 Dementia in other diseases classified elsewhere, unspecified severity, with agitation: Secondary | ICD-10-CM | POA: Diagnosis not present

## 2022-11-07 DIAGNOSIS — G311 Senile degeneration of brain, not elsewhere classified: Secondary | ICD-10-CM | POA: Diagnosis not present

## 2022-11-07 DIAGNOSIS — R32 Unspecified urinary incontinence: Secondary | ICD-10-CM | POA: Diagnosis not present

## 2022-11-09 DIAGNOSIS — F02811 Dementia in other diseases classified elsewhere, unspecified severity, with agitation: Secondary | ICD-10-CM | POA: Diagnosis not present

## 2022-11-09 DIAGNOSIS — F0283 Dementia in other diseases classified elsewhere, unspecified severity, with mood disturbance: Secondary | ICD-10-CM | POA: Diagnosis not present

## 2022-11-09 DIAGNOSIS — R159 Full incontinence of feces: Secondary | ICD-10-CM | POA: Diagnosis not present

## 2022-11-09 DIAGNOSIS — Z741 Need for assistance with personal care: Secondary | ICD-10-CM | POA: Diagnosis not present

## 2022-11-09 DIAGNOSIS — Z7401 Bed confinement status: Secondary | ICD-10-CM | POA: Diagnosis not present

## 2022-11-09 DIAGNOSIS — Z87440 Personal history of urinary (tract) infections: Secondary | ICD-10-CM | POA: Diagnosis not present

## 2022-11-09 DIAGNOSIS — N400 Enlarged prostate without lower urinary tract symptoms: Secondary | ICD-10-CM | POA: Diagnosis not present

## 2022-11-09 DIAGNOSIS — G311 Senile degeneration of brain, not elsewhere classified: Secondary | ICD-10-CM | POA: Diagnosis not present

## 2022-11-09 DIAGNOSIS — R32 Unspecified urinary incontinence: Secondary | ICD-10-CM | POA: Diagnosis not present

## 2022-11-14 ENCOUNTER — Ambulatory Visit (INDEPENDENT_AMBULATORY_CARE_PROVIDER_SITE_OTHER): Payer: Medicare Other

## 2022-11-14 DIAGNOSIS — G311 Senile degeneration of brain, not elsewhere classified: Secondary | ICD-10-CM | POA: Diagnosis not present

## 2022-11-14 DIAGNOSIS — N400 Enlarged prostate without lower urinary tract symptoms: Secondary | ICD-10-CM | POA: Diagnosis not present

## 2022-11-14 DIAGNOSIS — I441 Atrioventricular block, second degree: Secondary | ICD-10-CM

## 2022-11-14 DIAGNOSIS — F02811 Dementia in other diseases classified elsewhere, unspecified severity, with agitation: Secondary | ICD-10-CM | POA: Diagnosis not present

## 2022-11-14 DIAGNOSIS — R32 Unspecified urinary incontinence: Secondary | ICD-10-CM | POA: Diagnosis not present

## 2022-11-14 DIAGNOSIS — F0283 Dementia in other diseases classified elsewhere, unspecified severity, with mood disturbance: Secondary | ICD-10-CM | POA: Diagnosis not present

## 2022-11-14 DIAGNOSIS — R159 Full incontinence of feces: Secondary | ICD-10-CM | POA: Diagnosis not present

## 2022-11-21 DIAGNOSIS — G311 Senile degeneration of brain, not elsewhere classified: Secondary | ICD-10-CM | POA: Diagnosis not present

## 2022-11-21 DIAGNOSIS — N400 Enlarged prostate without lower urinary tract symptoms: Secondary | ICD-10-CM | POA: Diagnosis not present

## 2022-11-21 DIAGNOSIS — R32 Unspecified urinary incontinence: Secondary | ICD-10-CM | POA: Diagnosis not present

## 2022-11-21 DIAGNOSIS — R159 Full incontinence of feces: Secondary | ICD-10-CM | POA: Diagnosis not present

## 2022-11-21 DIAGNOSIS — F02811 Dementia in other diseases classified elsewhere, unspecified severity, with agitation: Secondary | ICD-10-CM | POA: Diagnosis not present

## 2022-11-21 DIAGNOSIS — F0283 Dementia in other diseases classified elsewhere, unspecified severity, with mood disturbance: Secondary | ICD-10-CM | POA: Diagnosis not present

## 2022-11-28 DIAGNOSIS — R32 Unspecified urinary incontinence: Secondary | ICD-10-CM | POA: Diagnosis not present

## 2022-11-28 DIAGNOSIS — F0283 Dementia in other diseases classified elsewhere, unspecified severity, with mood disturbance: Secondary | ICD-10-CM | POA: Diagnosis not present

## 2022-11-28 DIAGNOSIS — F02811 Dementia in other diseases classified elsewhere, unspecified severity, with agitation: Secondary | ICD-10-CM | POA: Diagnosis not present

## 2022-11-28 DIAGNOSIS — G311 Senile degeneration of brain, not elsewhere classified: Secondary | ICD-10-CM | POA: Diagnosis not present

## 2022-11-28 DIAGNOSIS — R159 Full incontinence of feces: Secondary | ICD-10-CM | POA: Diagnosis not present

## 2022-11-28 DIAGNOSIS — N400 Enlarged prostate without lower urinary tract symptoms: Secondary | ICD-10-CM | POA: Diagnosis not present

## 2022-11-30 NOTE — Progress Notes (Signed)
Remote pacemaker transmission.   

## 2022-12-05 DIAGNOSIS — N400 Enlarged prostate without lower urinary tract symptoms: Secondary | ICD-10-CM | POA: Diagnosis not present

## 2022-12-05 DIAGNOSIS — F0283 Dementia in other diseases classified elsewhere, unspecified severity, with mood disturbance: Secondary | ICD-10-CM | POA: Diagnosis not present

## 2022-12-05 DIAGNOSIS — R159 Full incontinence of feces: Secondary | ICD-10-CM | POA: Diagnosis not present

## 2022-12-05 DIAGNOSIS — F02811 Dementia in other diseases classified elsewhere, unspecified severity, with agitation: Secondary | ICD-10-CM | POA: Diagnosis not present

## 2022-12-05 DIAGNOSIS — R32 Unspecified urinary incontinence: Secondary | ICD-10-CM | POA: Diagnosis not present

## 2022-12-05 DIAGNOSIS — G311 Senile degeneration of brain, not elsewhere classified: Secondary | ICD-10-CM | POA: Diagnosis not present

## 2022-12-10 DIAGNOSIS — G311 Senile degeneration of brain, not elsewhere classified: Secondary | ICD-10-CM | POA: Diagnosis not present

## 2022-12-11 DIAGNOSIS — G311 Senile degeneration of brain, not elsewhere classified: Secondary | ICD-10-CM | POA: Diagnosis not present

## 2022-12-12 DIAGNOSIS — G311 Senile degeneration of brain, not elsewhere classified: Secondary | ICD-10-CM | POA: Diagnosis not present

## 2022-12-13 DIAGNOSIS — G311 Senile degeneration of brain, not elsewhere classified: Secondary | ICD-10-CM | POA: Diagnosis not present

## 2022-12-14 DIAGNOSIS — G311 Senile degeneration of brain, not elsewhere classified: Secondary | ICD-10-CM | POA: Diagnosis not present

## 2022-12-15 DIAGNOSIS — G311 Senile degeneration of brain, not elsewhere classified: Secondary | ICD-10-CM | POA: Diagnosis not present

## 2022-12-16 DIAGNOSIS — G311 Senile degeneration of brain, not elsewhere classified: Secondary | ICD-10-CM | POA: Diagnosis not present

## 2022-12-17 DIAGNOSIS — G311 Senile degeneration of brain, not elsewhere classified: Secondary | ICD-10-CM | POA: Diagnosis not present

## 2022-12-18 DIAGNOSIS — G311 Senile degeneration of brain, not elsewhere classified: Secondary | ICD-10-CM | POA: Diagnosis not present

## 2022-12-19 DIAGNOSIS — G311 Senile degeneration of brain, not elsewhere classified: Secondary | ICD-10-CM | POA: Diagnosis not present

## 2022-12-20 DIAGNOSIS — G311 Senile degeneration of brain, not elsewhere classified: Secondary | ICD-10-CM | POA: Diagnosis not present

## 2022-12-21 DIAGNOSIS — G311 Senile degeneration of brain, not elsewhere classified: Secondary | ICD-10-CM | POA: Diagnosis not present

## 2022-12-22 DIAGNOSIS — G311 Senile degeneration of brain, not elsewhere classified: Secondary | ICD-10-CM | POA: Diagnosis not present

## 2022-12-23 DIAGNOSIS — G311 Senile degeneration of brain, not elsewhere classified: Secondary | ICD-10-CM | POA: Diagnosis not present

## 2022-12-24 DIAGNOSIS — G311 Senile degeneration of brain, not elsewhere classified: Secondary | ICD-10-CM | POA: Diagnosis not present

## 2022-12-25 DIAGNOSIS — G311 Senile degeneration of brain, not elsewhere classified: Secondary | ICD-10-CM | POA: Diagnosis not present

## 2022-12-26 DIAGNOSIS — G311 Senile degeneration of brain, not elsewhere classified: Secondary | ICD-10-CM | POA: Diagnosis not present

## 2022-12-27 DIAGNOSIS — G311 Senile degeneration of brain, not elsewhere classified: Secondary | ICD-10-CM | POA: Diagnosis not present

## 2022-12-28 DIAGNOSIS — G311 Senile degeneration of brain, not elsewhere classified: Secondary | ICD-10-CM | POA: Diagnosis not present

## 2022-12-29 DIAGNOSIS — G311 Senile degeneration of brain, not elsewhere classified: Secondary | ICD-10-CM | POA: Diagnosis not present

## 2022-12-30 DIAGNOSIS — G311 Senile degeneration of brain, not elsewhere classified: Secondary | ICD-10-CM | POA: Diagnosis not present

## 2022-12-31 DIAGNOSIS — G311 Senile degeneration of brain, not elsewhere classified: Secondary | ICD-10-CM | POA: Diagnosis not present

## 2023-01-01 DIAGNOSIS — G311 Senile degeneration of brain, not elsewhere classified: Secondary | ICD-10-CM | POA: Diagnosis not present

## 2023-01-02 DIAGNOSIS — G311 Senile degeneration of brain, not elsewhere classified: Secondary | ICD-10-CM | POA: Diagnosis not present

## 2023-01-03 DIAGNOSIS — G311 Senile degeneration of brain, not elsewhere classified: Secondary | ICD-10-CM | POA: Diagnosis not present

## 2023-01-04 DIAGNOSIS — G311 Senile degeneration of brain, not elsewhere classified: Secondary | ICD-10-CM | POA: Diagnosis not present

## 2023-01-05 DIAGNOSIS — G311 Senile degeneration of brain, not elsewhere classified: Secondary | ICD-10-CM | POA: Diagnosis not present

## 2023-01-06 DIAGNOSIS — G311 Senile degeneration of brain, not elsewhere classified: Secondary | ICD-10-CM | POA: Diagnosis not present

## 2023-01-07 DIAGNOSIS — G311 Senile degeneration of brain, not elsewhere classified: Secondary | ICD-10-CM | POA: Diagnosis not present

## 2023-01-08 DIAGNOSIS — G311 Senile degeneration of brain, not elsewhere classified: Secondary | ICD-10-CM | POA: Diagnosis not present

## 2023-01-09 DIAGNOSIS — G311 Senile degeneration of brain, not elsewhere classified: Secondary | ICD-10-CM | POA: Diagnosis not present

## 2023-01-10 DIAGNOSIS — G311 Senile degeneration of brain, not elsewhere classified: Secondary | ICD-10-CM | POA: Diagnosis not present

## 2023-01-11 DIAGNOSIS — G311 Senile degeneration of brain, not elsewhere classified: Secondary | ICD-10-CM | POA: Diagnosis not present

## 2023-01-12 DIAGNOSIS — G311 Senile degeneration of brain, not elsewhere classified: Secondary | ICD-10-CM | POA: Diagnosis not present

## 2023-01-13 DIAGNOSIS — G311 Senile degeneration of brain, not elsewhere classified: Secondary | ICD-10-CM | POA: Diagnosis not present

## 2023-01-14 DIAGNOSIS — G311 Senile degeneration of brain, not elsewhere classified: Secondary | ICD-10-CM | POA: Diagnosis not present

## 2023-01-15 DIAGNOSIS — G311 Senile degeneration of brain, not elsewhere classified: Secondary | ICD-10-CM | POA: Diagnosis not present

## 2023-01-16 DIAGNOSIS — G311 Senile degeneration of brain, not elsewhere classified: Secondary | ICD-10-CM | POA: Diagnosis not present

## 2023-01-17 DIAGNOSIS — G311 Senile degeneration of brain, not elsewhere classified: Secondary | ICD-10-CM | POA: Diagnosis not present

## 2023-01-18 DIAGNOSIS — G311 Senile degeneration of brain, not elsewhere classified: Secondary | ICD-10-CM | POA: Diagnosis not present

## 2023-01-19 DIAGNOSIS — G311 Senile degeneration of brain, not elsewhere classified: Secondary | ICD-10-CM | POA: Diagnosis not present

## 2023-01-20 DIAGNOSIS — G311 Senile degeneration of brain, not elsewhere classified: Secondary | ICD-10-CM | POA: Diagnosis not present

## 2023-01-21 DIAGNOSIS — G311 Senile degeneration of brain, not elsewhere classified: Secondary | ICD-10-CM | POA: Diagnosis not present

## 2023-01-22 DIAGNOSIS — G311 Senile degeneration of brain, not elsewhere classified: Secondary | ICD-10-CM | POA: Diagnosis not present

## 2023-01-23 DIAGNOSIS — G311 Senile degeneration of brain, not elsewhere classified: Secondary | ICD-10-CM | POA: Diagnosis not present

## 2023-01-24 DIAGNOSIS — G311 Senile degeneration of brain, not elsewhere classified: Secondary | ICD-10-CM | POA: Diagnosis not present

## 2023-01-25 DIAGNOSIS — G311 Senile degeneration of brain, not elsewhere classified: Secondary | ICD-10-CM | POA: Diagnosis not present

## 2023-01-26 DIAGNOSIS — G311 Senile degeneration of brain, not elsewhere classified: Secondary | ICD-10-CM | POA: Diagnosis not present

## 2023-01-27 DIAGNOSIS — G311 Senile degeneration of brain, not elsewhere classified: Secondary | ICD-10-CM | POA: Diagnosis not present

## 2023-01-28 DIAGNOSIS — G311 Senile degeneration of brain, not elsewhere classified: Secondary | ICD-10-CM | POA: Diagnosis not present

## 2023-01-29 DIAGNOSIS — G311 Senile degeneration of brain, not elsewhere classified: Secondary | ICD-10-CM | POA: Diagnosis not present

## 2023-01-30 DIAGNOSIS — G311 Senile degeneration of brain, not elsewhere classified: Secondary | ICD-10-CM | POA: Diagnosis not present

## 2023-01-31 DIAGNOSIS — G311 Senile degeneration of brain, not elsewhere classified: Secondary | ICD-10-CM | POA: Diagnosis not present

## 2023-02-01 DIAGNOSIS — G311 Senile degeneration of brain, not elsewhere classified: Secondary | ICD-10-CM | POA: Diagnosis not present

## 2023-02-02 DIAGNOSIS — G311 Senile degeneration of brain, not elsewhere classified: Secondary | ICD-10-CM | POA: Diagnosis not present

## 2023-02-03 DIAGNOSIS — G311 Senile degeneration of brain, not elsewhere classified: Secondary | ICD-10-CM | POA: Diagnosis not present

## 2023-02-04 DIAGNOSIS — G311 Senile degeneration of brain, not elsewhere classified: Secondary | ICD-10-CM | POA: Diagnosis not present

## 2023-02-05 DIAGNOSIS — G311 Senile degeneration of brain, not elsewhere classified: Secondary | ICD-10-CM | POA: Diagnosis not present

## 2023-02-06 DIAGNOSIS — G311 Senile degeneration of brain, not elsewhere classified: Secondary | ICD-10-CM | POA: Diagnosis not present

## 2023-02-07 DIAGNOSIS — G311 Senile degeneration of brain, not elsewhere classified: Secondary | ICD-10-CM | POA: Diagnosis not present

## 2023-02-08 DIAGNOSIS — G311 Senile degeneration of brain, not elsewhere classified: Secondary | ICD-10-CM | POA: Diagnosis not present

## 2023-02-09 DIAGNOSIS — G311 Senile degeneration of brain, not elsewhere classified: Secondary | ICD-10-CM | POA: Diagnosis not present

## 2023-02-13 ENCOUNTER — Ambulatory Visit (INDEPENDENT_AMBULATORY_CARE_PROVIDER_SITE_OTHER): Payer: Medicare Other

## 2023-02-13 DIAGNOSIS — I441 Atrioventricular block, second degree: Secondary | ICD-10-CM

## 2023-02-13 DIAGNOSIS — G311 Senile degeneration of brain, not elsewhere classified: Secondary | ICD-10-CM | POA: Diagnosis not present

## 2023-02-14 LAB — CUP PACEART REMOTE DEVICE CHECK
Battery Remaining Longevity: 64 mo
Battery Voltage: 2.98 V
Brady Statistic AP VP Percent: 30.45 %
Brady Statistic AP VS Percent: 0.01 %
Brady Statistic AS VP Percent: 69.12 %
Brady Statistic AS VS Percent: 0.41 %
Brady Statistic RA Percent Paced: 30.44 %
Brady Statistic RV Percent Paced: 99.57 %
Date Time Interrogation Session: 20241104210724
Implantable Lead Connection Status: 753985
Implantable Lead Connection Status: 753985
Implantable Lead Implant Date: 20210510
Implantable Lead Implant Date: 20210510
Implantable Lead Location: 753859
Implantable Lead Location: 753860
Implantable Lead Model: 3830
Implantable Lead Model: 5076
Implantable Pulse Generator Implant Date: 20210510
Lead Channel Impedance Value: 304 Ohm
Lead Channel Impedance Value: 323 Ohm
Lead Channel Impedance Value: 418 Ohm
Lead Channel Impedance Value: 437 Ohm
Lead Channel Pacing Threshold Amplitude: 0.5 V
Lead Channel Pacing Threshold Amplitude: 1.875 V
Lead Channel Pacing Threshold Pulse Width: 0.4 ms
Lead Channel Pacing Threshold Pulse Width: 0.4 ms
Lead Channel Sensing Intrinsic Amplitude: 2.25 mV
Lead Channel Sensing Intrinsic Amplitude: 2.25 mV
Lead Channel Sensing Intrinsic Amplitude: 5 mV
Lead Channel Sensing Intrinsic Amplitude: 5 mV
Lead Channel Setting Pacing Amplitude: 1.5 V
Lead Channel Setting Pacing Amplitude: 3.75 V
Lead Channel Setting Pacing Pulse Width: 0.4 ms
Lead Channel Setting Sensing Sensitivity: 1.2 mV
Zone Setting Status: 755011
Zone Setting Status: 755011

## 2023-02-20 DIAGNOSIS — G311 Senile degeneration of brain, not elsewhere classified: Secondary | ICD-10-CM | POA: Diagnosis not present

## 2023-02-27 DIAGNOSIS — G311 Senile degeneration of brain, not elsewhere classified: Secondary | ICD-10-CM | POA: Diagnosis not present

## 2023-03-06 DIAGNOSIS — G311 Senile degeneration of brain, not elsewhere classified: Secondary | ICD-10-CM | POA: Diagnosis not present

## 2023-03-09 DIAGNOSIS — G311 Senile degeneration of brain, not elsewhere classified: Secondary | ICD-10-CM | POA: Diagnosis not present

## 2023-03-11 DIAGNOSIS — G311 Senile degeneration of brain, not elsewhere classified: Secondary | ICD-10-CM | POA: Diagnosis not present

## 2023-03-13 DIAGNOSIS — G311 Senile degeneration of brain, not elsewhere classified: Secondary | ICD-10-CM | POA: Diagnosis not present

## 2023-03-13 NOTE — Progress Notes (Signed)
Remote pacemaker transmission.   

## 2023-03-20 DIAGNOSIS — G311 Senile degeneration of brain, not elsewhere classified: Secondary | ICD-10-CM | POA: Diagnosis not present

## 2023-03-27 DIAGNOSIS — G311 Senile degeneration of brain, not elsewhere classified: Secondary | ICD-10-CM | POA: Diagnosis not present

## 2023-03-29 DIAGNOSIS — G311 Senile degeneration of brain, not elsewhere classified: Secondary | ICD-10-CM | POA: Diagnosis not present

## 2023-04-03 DIAGNOSIS — G311 Senile degeneration of brain, not elsewhere classified: Secondary | ICD-10-CM | POA: Diagnosis not present

## 2023-04-09 DIAGNOSIS — G311 Senile degeneration of brain, not elsewhere classified: Secondary | ICD-10-CM | POA: Diagnosis not present

## 2023-04-10 DIAGNOSIS — G311 Senile degeneration of brain, not elsewhere classified: Secondary | ICD-10-CM | POA: Diagnosis not present

## 2023-05-15 ENCOUNTER — Ambulatory Visit (INDEPENDENT_AMBULATORY_CARE_PROVIDER_SITE_OTHER): Payer: Medicare Other

## 2023-05-15 DIAGNOSIS — I441 Atrioventricular block, second degree: Secondary | ICD-10-CM | POA: Diagnosis not present

## 2023-05-16 ENCOUNTER — Encounter: Payer: Self-pay | Admitting: Internal Medicine

## 2023-05-16 LAB — CUP PACEART REMOTE DEVICE CHECK
Battery Remaining Longevity: 67 mo
Battery Voltage: 2.97 V
Brady Statistic AP VP Percent: 28.16 %
Brady Statistic AP VS Percent: 0.02 %
Brady Statistic AS VP Percent: 71.49 %
Brady Statistic AS VS Percent: 0.33 %
Brady Statistic RA Percent Paced: 28.07 %
Brady Statistic RV Percent Paced: 99.65 %
Date Time Interrogation Session: 20250204015802
Implantable Lead Connection Status: 753985
Implantable Lead Connection Status: 753985
Implantable Lead Implant Date: 20210510
Implantable Lead Implant Date: 20210510
Implantable Lead Location: 753859
Implantable Lead Location: 753860
Implantable Lead Model: 3830
Implantable Lead Model: 5076
Implantable Pulse Generator Implant Date: 20210510
Lead Channel Impedance Value: 304 Ohm
Lead Channel Impedance Value: 323 Ohm
Lead Channel Impedance Value: 418 Ohm
Lead Channel Impedance Value: 437 Ohm
Lead Channel Pacing Threshold Amplitude: 0.5 V
Lead Channel Pacing Threshold Amplitude: 1.375 V
Lead Channel Pacing Threshold Pulse Width: 0.4 ms
Lead Channel Pacing Threshold Pulse Width: 0.4 ms
Lead Channel Sensing Intrinsic Amplitude: 1.625 mV
Lead Channel Sensing Intrinsic Amplitude: 1.625 mV
Lead Channel Sensing Intrinsic Amplitude: 4.125 mV
Lead Channel Sensing Intrinsic Amplitude: 4.125 mV
Lead Channel Setting Pacing Amplitude: 1.5 V
Lead Channel Setting Pacing Amplitude: 3.5 V
Lead Channel Setting Pacing Pulse Width: 0.4 ms
Lead Channel Setting Sensing Sensitivity: 1.2 mV
Zone Setting Status: 755011
Zone Setting Status: 755011

## 2023-06-21 NOTE — Addendum Note (Signed)
 Addended by: Geralyn Flash D on: 06/21/2023 03:06 PM   Modules accepted: Orders

## 2023-06-21 NOTE — Progress Notes (Signed)
 Remote pacemaker transmission.

## 2023-08-14 ENCOUNTER — Ambulatory Visit (INDEPENDENT_AMBULATORY_CARE_PROVIDER_SITE_OTHER): Payer: Medicare Other

## 2023-08-14 DIAGNOSIS — I441 Atrioventricular block, second degree: Secondary | ICD-10-CM

## 2023-08-14 LAB — CUP PACEART REMOTE DEVICE CHECK
Battery Remaining Longevity: 63 mo
Battery Voltage: 2.97 V
Brady Statistic AP VP Percent: 23.18 %
Brady Statistic AP VS Percent: 0.01 %
Brady Statistic AS VP Percent: 76.43 %
Brady Statistic AS VS Percent: 0.37 %
Brady Statistic RA Percent Paced: 23.03 %
Brady Statistic RV Percent Paced: 99.61 %
Date Time Interrogation Session: 20250506054418
Implantable Lead Connection Status: 753985
Implantable Lead Connection Status: 753985
Implantable Lead Implant Date: 20210510
Implantable Lead Implant Date: 20210510
Implantable Lead Location: 753859
Implantable Lead Location: 753860
Implantable Lead Model: 3830
Implantable Lead Model: 5076
Implantable Pulse Generator Implant Date: 20210510
Lead Channel Impedance Value: 323 Ohm
Lead Channel Impedance Value: 323 Ohm
Lead Channel Impedance Value: 418 Ohm
Lead Channel Impedance Value: 437 Ohm
Lead Channel Pacing Threshold Amplitude: 0.5 V
Lead Channel Pacing Threshold Amplitude: 1.25 V
Lead Channel Pacing Threshold Pulse Width: 0.4 ms
Lead Channel Pacing Threshold Pulse Width: 0.4 ms
Lead Channel Sensing Intrinsic Amplitude: 1.75 mV
Lead Channel Sensing Intrinsic Amplitude: 1.75 mV
Lead Channel Sensing Intrinsic Amplitude: 5.375 mV
Lead Channel Sensing Intrinsic Amplitude: 5.375 mV
Lead Channel Setting Pacing Amplitude: 1.5 V
Lead Channel Setting Pacing Amplitude: 3.5 V
Lead Channel Setting Pacing Pulse Width: 0.4 ms
Lead Channel Setting Sensing Sensitivity: 1.2 mV
Zone Setting Status: 755011
Zone Setting Status: 755011

## 2023-08-15 ENCOUNTER — Encounter: Payer: Self-pay | Admitting: Internal Medicine

## 2023-09-27 NOTE — Progress Notes (Signed)
 Remote pacemaker transmission.

## 2023-11-13 ENCOUNTER — Ambulatory Visit (INDEPENDENT_AMBULATORY_CARE_PROVIDER_SITE_OTHER): Payer: Medicare Other

## 2023-11-13 DIAGNOSIS — I441 Atrioventricular block, second degree: Secondary | ICD-10-CM

## 2023-11-14 LAB — CUP PACEART REMOTE DEVICE CHECK
Battery Remaining Longevity: 58 mo
Battery Voltage: 2.96 V
Brady Statistic AP VP Percent: 30.27 %
Brady Statistic AP VS Percent: 0.02 %
Brady Statistic AS VP Percent: 69.05 %
Brady Statistic AS VS Percent: 0.66 %
Brady Statistic RA Percent Paced: 30.08 %
Brady Statistic RV Percent Paced: 99.32 %
Date Time Interrogation Session: 20250804233649
Implantable Lead Connection Status: 753985
Implantable Lead Connection Status: 753985
Implantable Lead Implant Date: 20210510
Implantable Lead Implant Date: 20210510
Implantable Lead Location: 753859
Implantable Lead Location: 753860
Implantable Lead Model: 3830
Implantable Lead Model: 5076
Implantable Pulse Generator Implant Date: 20210510
Lead Channel Impedance Value: 304 Ohm
Lead Channel Impedance Value: 323 Ohm
Lead Channel Impedance Value: 399 Ohm
Lead Channel Impedance Value: 418 Ohm
Lead Channel Pacing Threshold Amplitude: 0.5 V
Lead Channel Pacing Threshold Amplitude: 1.625 V
Lead Channel Pacing Threshold Pulse Width: 0.4 ms
Lead Channel Pacing Threshold Pulse Width: 0.4 ms
Lead Channel Sensing Intrinsic Amplitude: 2.25 mV
Lead Channel Sensing Intrinsic Amplitude: 2.25 mV
Lead Channel Sensing Intrinsic Amplitude: 3 mV
Lead Channel Sensing Intrinsic Amplitude: 3 mV
Lead Channel Setting Pacing Amplitude: 1.5 V
Lead Channel Setting Pacing Amplitude: 3.5 V
Lead Channel Setting Pacing Pulse Width: 0.4 ms
Lead Channel Setting Sensing Sensitivity: 1.2 mV
Zone Setting Status: 755011
Zone Setting Status: 755011

## 2023-11-15 ENCOUNTER — Ambulatory Visit: Payer: Self-pay | Admitting: Internal Medicine

## 2024-01-07 NOTE — Progress Notes (Signed)
 Remote PPM Transmission

## 2024-02-12 ENCOUNTER — Ambulatory Visit: Payer: Medicare Other

## 2024-02-12 DIAGNOSIS — I441 Atrioventricular block, second degree: Secondary | ICD-10-CM

## 2024-02-13 LAB — CUP PACEART REMOTE DEVICE CHECK
Battery Remaining Longevity: 47 mo
Battery Voltage: 2.95 V
Brady Statistic AP VP Percent: 18.89 %
Brady Statistic AP VS Percent: 0.08 %
Brady Statistic AS VP Percent: 80.36 %
Brady Statistic AS VS Percent: 0.68 %
Brady Statistic RA Percent Paced: 18.9 %
Brady Statistic RV Percent Paced: 99.25 %
Date Time Interrogation Session: 20251105101213
Implantable Lead Connection Status: 753985
Implantable Lead Connection Status: 753985
Implantable Lead Implant Date: 20210510
Implantable Lead Implant Date: 20210510
Implantable Lead Location: 753859
Implantable Lead Location: 753860
Implantable Lead Model: 3830
Implantable Lead Model: 5076
Implantable Pulse Generator Implant Date: 20210510
Lead Channel Impedance Value: 304 Ohm
Lead Channel Impedance Value: 304 Ohm
Lead Channel Impedance Value: 418 Ohm
Lead Channel Impedance Value: 418 Ohm
Lead Channel Pacing Threshold Amplitude: 0.625 V
Lead Channel Pacing Threshold Amplitude: 1.5 V
Lead Channel Pacing Threshold Pulse Width: 0.4 ms
Lead Channel Pacing Threshold Pulse Width: 0.4 ms
Lead Channel Sensing Intrinsic Amplitude: 1.75 mV
Lead Channel Sensing Intrinsic Amplitude: 1.75 mV
Lead Channel Sensing Intrinsic Amplitude: 4.125 mV
Lead Channel Sensing Intrinsic Amplitude: 4.125 mV
Lead Channel Setting Pacing Amplitude: 1.5 V
Lead Channel Setting Pacing Amplitude: 3.75 V
Lead Channel Setting Pacing Pulse Width: 0.4 ms
Lead Channel Setting Sensing Sensitivity: 1.2 mV
Zone Setting Status: 755011
Zone Setting Status: 755011

## 2024-02-15 ENCOUNTER — Ambulatory Visit: Payer: Self-pay | Admitting: Internal Medicine

## 2024-02-15 NOTE — Progress Notes (Signed)
 Remote PPM Transmission

## 2024-05-13 ENCOUNTER — Ambulatory Visit: Payer: Medicare Other

## 2024-05-14 LAB — CUP PACEART REMOTE DEVICE CHECK
Battery Remaining Longevity: 44 mo
Battery Voltage: 2.95 V
Brady Statistic AP VP Percent: 30.22 %
Brady Statistic AP VS Percent: 0 %
Brady Statistic AS VP Percent: 69.55 %
Brady Statistic AS VS Percent: 0.22 %
Brady Statistic RA Percent Paced: 30.02 %
Brady Statistic RV Percent Paced: 99.78 %
Date Time Interrogation Session: 20260203040454
Implantable Lead Connection Status: 753985
Implantable Lead Connection Status: 753985
Implantable Lead Implant Date: 20210510
Implantable Lead Implant Date: 20210510
Implantable Lead Location: 753859
Implantable Lead Location: 753860
Implantable Lead Model: 3830
Implantable Lead Model: 5076
Implantable Pulse Generator Implant Date: 20210510
Lead Channel Impedance Value: 304 Ohm
Lead Channel Impedance Value: 323 Ohm
Lead Channel Impedance Value: 418 Ohm
Lead Channel Impedance Value: 437 Ohm
Lead Channel Pacing Threshold Amplitude: 0.625 V
Lead Channel Pacing Threshold Amplitude: 1.5 V
Lead Channel Pacing Threshold Pulse Width: 0.4 ms
Lead Channel Pacing Threshold Pulse Width: 0.4 ms
Lead Channel Sensing Intrinsic Amplitude: 2 mV
Lead Channel Sensing Intrinsic Amplitude: 2 mV
Lead Channel Sensing Intrinsic Amplitude: 4.125 mV
Lead Channel Sensing Intrinsic Amplitude: 4.125 mV
Lead Channel Setting Pacing Amplitude: 1.5 V
Lead Channel Setting Pacing Amplitude: 3.5 V
Lead Channel Setting Pacing Pulse Width: 0.4 ms
Lead Channel Setting Sensing Sensitivity: 1.2 mV
Zone Setting Status: 755011
Zone Setting Status: 755011

## 2024-08-12 ENCOUNTER — Ambulatory Visit

## 2024-11-11 ENCOUNTER — Ambulatory Visit

## 2025-02-10 ENCOUNTER — Ambulatory Visit

## 2025-05-12 ENCOUNTER — Ambulatory Visit

## 2025-08-11 ENCOUNTER — Ambulatory Visit

## 2025-11-10 ENCOUNTER — Ambulatory Visit

## 2026-02-09 ENCOUNTER — Ambulatory Visit

## 2026-05-11 ENCOUNTER — Ambulatory Visit
